# Patient Record
Sex: Female | Born: 1980 | Race: Black or African American | Hispanic: No | Marital: Single | Smoking: Current some day smoker
Health system: Southern US, Community
[De-identification: ages and names within clinical notes are randomized; demographics above are authoritative.]

## PROBLEM LIST (undated history)

## (undated) DIAGNOSIS — M549 Dorsalgia, unspecified: Secondary | ICD-10-CM

---

## 1998-08-04 ENCOUNTER — Emergency Department (HOSPITAL_COMMUNITY): Admission: EM | Admit: 1998-08-04 | Discharge: 1998-08-04 | Payer: Self-pay | Admitting: Emergency Medicine

## 1998-12-10 ENCOUNTER — Encounter: Admission: RE | Admit: 1998-12-10 | Discharge: 1998-12-10 | Payer: Self-pay | Admitting: Family Medicine

## 1999-01-01 ENCOUNTER — Encounter: Admission: RE | Admit: 1999-01-01 | Discharge: 1999-01-01 | Payer: Self-pay | Admitting: Family Medicine

## 2005-01-13 ENCOUNTER — Emergency Department (HOSPITAL_COMMUNITY): Admission: EM | Admit: 2005-01-13 | Discharge: 2005-01-13 | Payer: Self-pay | Admitting: Emergency Medicine

## 2005-05-25 ENCOUNTER — Emergency Department (HOSPITAL_COMMUNITY): Admission: EM | Admit: 2005-05-25 | Discharge: 2005-05-26 | Payer: Self-pay | Admitting: *Deleted

## 2007-02-21 ENCOUNTER — Emergency Department (HOSPITAL_COMMUNITY): Admission: EM | Admit: 2007-02-21 | Discharge: 2007-02-21 | Payer: Self-pay | Admitting: Emergency Medicine

## 2009-04-04 ENCOUNTER — Emergency Department (HOSPITAL_COMMUNITY): Admission: EM | Admit: 2009-04-04 | Discharge: 2009-04-04 | Payer: Self-pay | Admitting: Family Medicine

## 2009-11-13 ENCOUNTER — Emergency Department (HOSPITAL_COMMUNITY): Admission: EM | Admit: 2009-11-13 | Discharge: 2009-11-13 | Payer: Self-pay | Admitting: Emergency Medicine

## 2010-01-15 ENCOUNTER — Emergency Department (HOSPITAL_COMMUNITY): Admission: EM | Admit: 2010-01-15 | Discharge: 2010-01-15 | Payer: Self-pay | Admitting: Family Medicine

## 2010-03-29 ENCOUNTER — Emergency Department (HOSPITAL_COMMUNITY): Admission: EM | Admit: 2010-03-29 | Discharge: 2010-03-29 | Payer: Self-pay | Admitting: Emergency Medicine

## 2010-08-14 LAB — CULTURE, ROUTINE-ABSCESS

## 2012-01-01 ENCOUNTER — Emergency Department (INDEPENDENT_AMBULATORY_CARE_PROVIDER_SITE_OTHER)
Admission: EM | Admit: 2012-01-01 | Discharge: 2012-01-01 | Disposition: A | Discharge status: Home / Self Care | Payer: Self-pay | Source: Home / Self Care | Attending: Family Medicine | Admitting: Family Medicine

## 2012-01-01 ENCOUNTER — Encounter (HOSPITAL_COMMUNITY): Payer: Self-pay | Admitting: Emergency Medicine

## 2012-01-01 DIAGNOSIS — S43499A Other sprain of unspecified shoulder joint, initial encounter: Secondary | ICD-10-CM

## 2012-01-01 DIAGNOSIS — S46819A Strain of other muscles, fascia and tendons at shoulder and upper arm level, unspecified arm, initial encounter: Secondary | ICD-10-CM

## 2012-01-01 HISTORY — DX: Dorsalgia, unspecified: M54.9

## 2012-01-01 MED ORDER — METHOCARBAMOL 500 MG PO TABS: 500.0000 mg | ORAL_TABLET | Freq: Three times a day (TID) | ORAL | Status: AC

## 2012-01-01 MED ORDER — ACETAMINOPHEN-CODEINE #3 300-30 MG PO TABS: 1.0000 | ORAL_TABLET | Freq: Four times a day (QID) | ORAL | Status: AC | PRN

## 2012-01-01 MED ORDER — IBUPROFEN 600 MG PO TABS: 600.0000 mg | ORAL_TABLET | Freq: Three times a day (TID) | ORAL | Status: AC | PRN

## 2012-01-01 NOTE — ED Provider Notes (Signed)
History     CSN: [id number]  Arrival date & time 01/01/12  1105   First MD Initiated Contact with Patient 01/01/12 1105      Chief Complaint  Patient presents with  . Back Pain    (Consider location/radiation/quality/duration/timing/severity/associated sxs/prior treatment) HPI Comments: 31 year old female with history of recurrent back pain. Here complaining of back pain in the last 4 days. Reports her pain started in the lower back and is now in upper back and both sides of her neck. Denies pain radiation. No extremity weakness or paresthesia. 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 Patient reports she has received training in proper lifting technique. States she took a leftover muscle relaxant t tablet once a day in the first 2 days of her pain and one 800 ibuprofen tablet yesterday with minimal relief of her symptoms. Denies chest pain or shortness of breath. No cough or recent illness. No burning on urination or hematuria.   Past Medical History  Diagnosis Date  . Back pain     History reviewed. No pertinent past surgical history.  No family history on file.  History  Substance Use Topics  . Smoking status: Never Smoker   . Smokeless tobacco: Not on file  . Alcohol Use: No    OB History    Grav Para Term Preterm Abortions TAB SAB Ect Mult Living                  Review of Systems  Constitutional: Negative for fever, chills and fatigue.  HENT: Negative for congestion and sore throat.   Respiratory: Negative for cough and shortness of breath.   Gastrointestinal: Negative for nausea, vomiting, abdominal pain and diarrhea.  Genitourinary: Negative for dysuria, frequency, hematuria and flank pain.  Musculoskeletal: Positive for back pain.  Skin: Negative for rash.  Neurological: Negative for dizziness and headaches.    Allergies  Review of patient's allergies indicates no  known allergies.  Home Medications   Current Outpatient Rx  Name Route Sig Dispense Refill  . CYCLOBENZAPRINE HCL 10 MG PO TABS Oral Take 10 mg by mouth 3 (three) times daily as needed.    Marland Kitchen OVER THE COUNTER MEDICATION  Reports having old prescription medicine she thinks was a muscle relaxer-has "m" on one side and on the other "751".  Rubbing alcohol, epsom salts    . ACETAMINOPHEN-CODEINE #3 300-30 MG PO TABS Oral Take 1 tablet by mouth every 6 (six) hours as needed for pain. 20 tablet 0  . IBUPROFEN 600 MG PO TABS Oral Take 1 tablet (600 mg total) by mouth every 8 (eight) hours as needed for pain. 20 tablet 0  . METHOCARBAMOL 500 MG PO TABS Oral Take 1 tablet (500 mg total) by mouth 3 (three) times daily. 20 tablet 0    BP 99/63  Pulse 76  Temp 98.3 F (36.8 C) (Oral)  Resp 16  SpO2 100%  LMP 12/13/2011  Physical Exam  Nursing note and vitals reviewed. Constitutional: She is oriented to person, place, and time. She appears well-developed and well-nourished. No distress.  HENT:  Head: Normocephalic and atraumatic.  Right Ear: External ear normal.  Left Ear: External ear normal.  Mouth/Throat: Oropharynx is clear and moist. No oropharyngeal exudate.  Eyes: Conjunctivae are normal.  Neck: Normal range of motion. Neck supple. No JVD present. No thyromegaly present.       No pain with palpation  over spine bone prominences. Tenderness to palpation in bilateral paravertebral and trapezius muscles.  Otherwise FROM of the neck with negative Spurling's test.   Cardiovascular: Normal rate, regular rhythm and normal heart sounds.   Pulmonary/Chest: Effort normal and breath sounds normal. No respiratory distress. She has no wheezes. She has no rales. She exhibits no tenderness.  Abdominal: Soft. She exhibits no distension and no mass. There is no tenderness.       No CVT  Musculoskeletal:       Upper extremities and shoulders with from.   Lymphadenopathy:    She has no cervical  adenopathy.  Neurological: She is alert and oriented to person, place, and time.       Upper extremities with normal strength and appear neurovascularly intact.    Skin: No rash noted.    ED Course  Procedures (including critical care time)  Labs Reviewed - No data to display No results found.   1. Trapezius muscle strain       MDM  Treated with Robaxin, and ibuprofen and Tylenol No. 3. Recommended stretching and and back exercises as soon as pain improves. Return as needed.        Sharin Grave, MD 01/03/12 1103

## 2012-01-01 NOTE — ED Notes (Signed)
Reports back pain that started Monday -4 days ago.  History of back pain.  Mid to upper back pain-tingling/burning.  Took muscle relaxer and reports no improvement.  No numbness or tingling in arms.  Reports pain across shoulders.  Reports a lot of turning, twisting, bending routinely at work.  Denies any one incident causing pain

## 2012-01-01 NOTE — ED Notes (Signed)
Requested patient place on gown for physician evaluation

## 2013-03-13 ENCOUNTER — Encounter (HOSPITAL_COMMUNITY): Payer: Self-pay | Admitting: Emergency Medicine

## 2013-03-13 ENCOUNTER — Emergency Department (HOSPITAL_COMMUNITY)
Admission: EM | Admit: 2013-03-13 | Discharge: 2013-03-13 | Disposition: A | Discharge status: Home / Self Care | Payer: Self-pay | Attending: Emergency Medicine | Admitting: Emergency Medicine

## 2013-03-13 DIAGNOSIS — B9789 Other viral agents as the cause of diseases classified elsewhere: Secondary | ICD-10-CM | POA: Insufficient documentation

## 2013-03-13 DIAGNOSIS — B349 Viral infection, unspecified: Secondary | ICD-10-CM

## 2013-03-13 DIAGNOSIS — R05 Cough: Secondary | ICD-10-CM

## 2013-03-13 DIAGNOSIS — R5381 Other malaise: Secondary | ICD-10-CM | POA: Insufficient documentation

## 2013-03-13 DIAGNOSIS — R059 Cough, unspecified: Secondary | ICD-10-CM | POA: Insufficient documentation

## 2013-03-13 DIAGNOSIS — R52 Pain, unspecified: Secondary | ICD-10-CM | POA: Insufficient documentation

## 2013-03-13 MED ORDER — HYDROCODONE-HOMATROPINE 5-1.5 MG/5ML PO SYRP: 5.0000 mL | ORAL_SOLUTION | Freq: Four times a day (QID) | ORAL | Status: DC | PRN

## 2013-03-13 NOTE — ED Provider Notes (Signed)
CSN: [id number]     Arrival date & time 03/13/13  1532 History  This chart was scribed for non-physician practitioner, Teressa Lower, NP working with Purvis Sheffield, MD by Greggory Stallion, ED scribe. This patient was seen in room WTR7/WTR7 and the patient's care was started at 4:12 PM.   Chief Complaint  Patient presents with  . Cough  . Generalized Body Aches   The history is provided by the patient. No language interpreter was used.   HPI Comments: Stacy Woodard is a 32 y.o. female who presents to the Emergency Department complaining of unproductive cough, itchy throat and laryngitis that started 3 days ago. Pt is also having generalized body aches. She has taken robitussin with no relief. Pt denies nausea, emesis, diarrhea, fever, SOB and CP. Pt smokes cigarettes.   Past Medical History  Diagnosis Date  . Back pain    No past surgical history on file. No family history on file. History  Substance Use Topics  . Smoking status: Never Smoker   . Smokeless tobacco: Not on file  . Alcohol Use: No   OB History   Grav Para Term Preterm Abortions TAB SAB Ect Mult Living                 Review of Systems  Constitutional: Positive for fatigue. Negative for fever.  Respiratory: Positive for cough. Negative for shortness of breath.   Cardiovascular: Negative for chest pain.  Gastrointestinal: Negative for nausea, vomiting and diarrhea.  All other systems reviewed and are negative.    Allergies  Review of patient's allergies indicates no known allergies.  Home Medications   Current Outpatient Rx  Name  Route  Sig  Dispense  Refill  . cyclobenzaprine (FLEXERIL) 10 MG tablet   Oral   Take 10 mg by mouth 3 (three) times daily as needed.         Marland Kitchen OVER THE COUNTER MEDICATION      Reports having old prescription medicine she thinks was a muscle relaxer-has "m" on one side and on the other "751".  Rubbing alcohol, epsom salts          BP 127/86  Pulse 84  Temp(Src)  97.6 F (36.4 C) (Oral)  Resp 18  SpO2 98%  LMP 03/02/2013  Physical Exam  Nursing note and vitals reviewed. Constitutional: She is oriented to person, place, and time. She appears well-developed and well-nourished. No distress.  HENT:  Head: Normocephalic and atraumatic.  Throat mildly erythematous.   Eyes: EOM are normal.  Neck: Neck supple. No tracheal deviation present.  Cardiovascular: Normal rate, regular rhythm and normal heart sounds.   Pulmonary/Chest: Effort normal and breath sounds normal. No respiratory distress. She has no wheezes. She has no rales.  Musculoskeletal: Normal range of motion.  Neurological: She is alert and oriented to person, place, and time.  Skin: Skin is warm and dry.  Psychiatric: She has a normal mood and affect. Her behavior is normal.    ED Course  Procedures (including critical care time)  DIAGNOSTIC STUDIES: Oxygen Saturation is 98% on RA, normal by my interpretation.    COORDINATION OF CARE: 4:15 PM-Discussed treatment plan which includes symptomatic treatment with pt at bedside and pt agreed to plan.   Labs Review Labs Reviewed - No data to display Imaging Review No results found.  EKG Interpretation   None       MDM   1. Cough   2. Viral illness    Will treat  symptomatically    I personally performed the services described in this documentation, which was scribed in my presence. The recorded information has been reviewed and is accurate.   Teressa Lower, NP 03/13/13 [phone]

## 2013-03-13 NOTE — ED Notes (Signed)
Pt from home reports that she has had dry cough, itchy throat with laryngitis since x3 days. Pt denies N/V/D/fever, SOB, CP. Pt is A&O and in NAD

## 2013-03-14 NOTE — ED Provider Notes (Signed)
Medical screening examination/treatment/procedure(s) were performed by non-physician practitioner and as supervising physician I was immediately available for consultation/collaboration.  EKG Interpretation   None         Katilin Raynes S Quinesha Selinger, MD 03/14/13 1527 

## 2013-05-26 ENCOUNTER — Encounter (HOSPITAL_COMMUNITY): Payer: Self-pay | Admitting: Emergency Medicine

## 2013-05-26 ENCOUNTER — Emergency Department (HOSPITAL_COMMUNITY)
Admission: EM | Admit: 2013-05-26 | Discharge: 2013-05-26 | Disposition: A | Discharge status: Home / Self Care | Payer: Self-pay | Attending: Emergency Medicine | Admitting: Emergency Medicine

## 2013-05-26 DIAGNOSIS — J029 Acute pharyngitis, unspecified: Secondary | ICD-10-CM | POA: Insufficient documentation

## 2013-05-26 DIAGNOSIS — R112 Nausea with vomiting, unspecified: Secondary | ICD-10-CM | POA: Insufficient documentation

## 2013-05-26 LAB — RAPID STREP SCREEN (MED CTR MEBANE ONLY): STREPTOCOCCUS, GROUP A SCREEN (DIRECT): NEGATIVE

## 2013-05-26 MED ORDER — TRAMADOL HCL 50 MG PO TABS: 50.0000 mg | ORAL_TABLET | Freq: Four times a day (QID) | ORAL | Status: DC | PRN

## 2013-05-26 MED ORDER — KETOROLAC TROMETHAMINE 60 MG/2ML IM SOLN
60.0000 mg | Freq: Once | INTRAMUSCULAR | Status: AC
  Administered 2013-05-26: 60 mg via INTRAMUSCULAR
  Filled 2013-05-26: qty 2

## 2013-05-26 MED ORDER — NAPROXEN 500 MG PO TABS: 500.0000 mg | ORAL_TABLET | Freq: Two times a day (BID) | ORAL | Status: DC

## 2013-05-26 MED ORDER — DEXAMETHASONE SODIUM PHOSPHATE 10 MG/ML IJ SOLN
10.0000 mg | Freq: Once | INTRAMUSCULAR | Status: AC
  Administered 2013-05-26: 10 mg via INTRAMUSCULAR
  Filled 2013-05-26: qty 1

## 2013-05-26 NOTE — ED Provider Notes (Signed)
CSN: [id number]     Arrival date & time 05/26/13  1608 History   First MD Initiated Contact with Patient 05/26/13 1648     Chief Complaint  Patient presents with  . Sore Throat   (Consider location/radiation/quality/duration/timing/severity/associated sxs/prior Treatment) HPI Comments: Patient presents with sore throat x 3 days with associated fever, mild non-productive cough, vomiting. She has taken OTC meds without relief. She is losing voice. She is able to move neck without difficulty. The onset of this condition was acute. The course is constant. Aggravating factors: swallowing. Alleviating factors: none.    Patient is a 33 y.o. female presenting with pharyngitis. The history is provided by the patient.  Sore Throat Associated symptoms include chills, a fever, nausea, a sore throat and vomiting. Pertinent negatives include no abdominal pain, congestion, coughing, fatigue, headaches, myalgias or rash.    Past Medical History  Diagnosis Date  . Back pain    History reviewed. No pertinent past surgical history. No family history on file. History  Substance Use Topics  . Smoking status: Never Smoker   . Smokeless tobacco: Not on file  . Alcohol Use: No   OB History   Grav Para Term Preterm Abortions TAB SAB Ect Mult Living                 Review of Systems  Constitutional: Positive for fever, chills and appetite change. Negative for fatigue.  HENT: Positive for sore throat and trouble swallowing (able to swallow but painful). Negative for congestion, ear pain, rhinorrhea and sinus pressure.   Eyes: Negative for redness.  Respiratory: Negative for cough and wheezing.   Gastrointestinal: Positive for nausea and vomiting. Negative for abdominal pain and diarrhea.  Genitourinary: Negative for dysuria.  Musculoskeletal: Negative for myalgias and neck stiffness.  Skin: Negative for rash.  Neurological: Negative for headaches.  Hematological: Negative for adenopathy.     Allergies  Review of patient's allergies indicates no known allergies.  Home Medications   Current Outpatient Rx  Name  Route  Sig  Dispense  Refill  . naproxen (NAPROSYN) 500 MG tablet   Oral   Take 1 tablet (500 mg total) by mouth 2 (two) times daily.   20 tablet   0   . traMADol (ULTRAM) 50 MG tablet   Oral   Take 1 tablet (50 mg total) by mouth every 6 (six) hours as needed.   15 tablet   0    BP 136/83  Pulse 96  Temp(Src) 101.1 F (38.4 C) (Oral)  Resp 20  SpO2 99% Physical Exam  Nursing note and vitals reviewed. Constitutional: She appears well-developed and well-nourished.  HENT:  Head: Normocephalic and atraumatic.  Right Ear: External ear normal.  Left Ear: External ear normal.  Nose: Nose normal. No mucosal edema or rhinorrhea.  Mouth/Throat: Uvula is midline and mucous membranes are normal. Mucous membranes are not dry. No oral lesions. No trismus in the jaw. No uvula swelling. Posterior oropharyngeal erythema present. No oropharyngeal exudate, posterior oropharyngeal edema or tonsillar abscesses.  Eyes: Conjunctivae are normal. Right eye exhibits no discharge. Left eye exhibits no discharge.  Neck: Normal range of motion. Neck supple.  Cardiovascular: Normal rate, regular rhythm and normal heart sounds.   Pulmonary/Chest: Effort normal and breath sounds normal. No respiratory distress. She has no wheezes. She has no rales.  Abdominal: Soft. There is no tenderness.  Lymphadenopathy:    She has no cervical adenopathy.  Neurological: She is alert.  Skin: Skin is warm  and dry.  Psychiatric: She has a normal mood and affect.    ED Course  Procedures (including critical care time) Labs Review Labs Reviewed  RAPID STREP SCREEN  CULTURE, GROUP A STREP   Imaging Review No results found.  EKG Interpretation   None      5:05 PM Patient seen and examined. Work-up initiated. Medications ordered.   Vital signs reviewed and are as follows: Filed  Vitals:   05/26/13 1624  BP: 136/83  Pulse: 96  Temp: 101.1 F (38.4 C)  Resp: 20   Strep performed.   6:04 PM Strep neg. Pt feeling slightly better. Will d/c to home with tramadol, naproxen.   Patient counseled on supportive care for viral infection and s/s to return including worsening symptoms, persistent fever, persistent vomiting, or if they have any other concerns.  Urged to see PCP if symptoms persist for more than 3 days. Patient verbalizes understanding and agrees with plan.   Patient counseled on use of narcotic pain medications. Counseled not to combine these medications with others containing tylenol. Urged not to drink alcohol, drive, or perform any other activities that requires focus while taking these medications. The patient verbalizes understanding and agrees with the plan.    MDM   1. Pharyngitis    Pharyngitis/fever: no abscess seen on exam, no deep space infection suspected. Patient without trismus and full ROM neck without pain. She appears non-toxic. Given steroids/toradol in ED for symptomatic relief. Pain medicine and NSAID for home.     Renne CriglerJoshua Adianna Darwin, PA-C 05/26/13 1806

## 2013-05-26 NOTE — Discharge Instructions (Signed)
Please read and follow all provided instructions.  Your diagnoses today include:  1. Pharyngitis     Tests performed today include:  Strep test: was negative for strep throat  Strep culture: you will be notified if this comes back positive  Vital signs. See below for your results today.   Medications prescribed:   Tramadol - narcotic-like pain medication  DO NOT drive or perform any activities that require you to be awake and alert because this medicine can make you drowsy.    Naproxen - anti-inflammatory pain medication  Do not exceed 500mg  naproxen every 12 hours, take with food  You have been prescribed an anti-inflammatory medication or NSAID. Take with food. Take smallest effective dose for the shortest duration needed for your pain. Stop taking if you experience stomach pain or vomiting.   Home care instructions:  Please read the educational materials provided and follow any instructions contained in this packet.  Follow-up instructions: Please follow-up with your primary care provider as needed for further evaluation of your symptoms.  If you do not have a primary care doctor -- see below for referral information.   Return instructions:   Please return to the Emergency Department if you experience worsening symptoms.   Return if you have worsening problems swallowing, your neck becomes swollen, you cannot swallow your saliva or your voice becomes muffled.   Return with high persistent fever, persistent vomiting, or if you have trouble breathing.   Please return if you have any other emergent concerns.  Additional Information:  Your vital signs today were: BP 136/83   Pulse 96   Temp(Src) 101.1 F (38.4 C) (Oral)   Resp 20   SpO2 99% If your blood pressure (BP) was elevated above 135/85 this visit, please have this repeated by your doctor within one month. --------------

## 2013-05-26 NOTE — ED Notes (Addendum)
Pt reports having a itchy throat and difficulty swallowing. Pt reports the sore throat started three days ago. Pt has an oxygen saturation of 99% on room air and airway is patent. Pt reports having nausea and emesis. Pt is A/O x4.  Pt unable to tolerate strep test in triage.

## 2013-05-26 NOTE — ED Provider Notes (Signed)
Medical screening examination/treatment/procedure(s) were performed by non-physician practitioner and as supervising physician I was immediately available for consultation/collaboration.   Celene KrasJon R Takenya Travaglini, MD 05/26/13 [id number]

## 2013-05-28 LAB — CULTURE, GROUP A STREP

## 2014-10-27 ENCOUNTER — Encounter (HOSPITAL_COMMUNITY): Payer: Self-pay

## 2014-10-27 ENCOUNTER — Emergency Department (HOSPITAL_COMMUNITY): Payer: 59

## 2014-10-27 ENCOUNTER — Emergency Department (HOSPITAL_COMMUNITY)
Admission: EM | Admit: 2014-10-27 | Discharge: 2014-10-27 | Disposition: A | Discharge status: Home / Self Care | Payer: 59 | Attending: Emergency Medicine | Admitting: Emergency Medicine

## 2014-10-27 DIAGNOSIS — R200 Anesthesia of skin: Secondary | ICD-10-CM | POA: Diagnosis not present

## 2014-10-27 DIAGNOSIS — M79645 Pain in left finger(s): Secondary | ICD-10-CM

## 2014-10-27 DIAGNOSIS — Z720 Tobacco use: Secondary | ICD-10-CM | POA: Insufficient documentation

## 2014-10-27 DIAGNOSIS — Z791 Long term (current) use of non-steroidal anti-inflammatories (NSAID): Secondary | ICD-10-CM | POA: Diagnosis not present

## 2014-10-27 MED ORDER — MELOXICAM 7.5 MG PO TABS: 15.0000 mg | ORAL_TABLET | Freq: Every day | ORAL | Status: DC

## 2014-10-27 NOTE — ED Provider Notes (Signed)
CSN: [id number]     Arrival date & time 10/27/14  1808 History  This chart was scribed for non-physician provider Antony Madura, PA-C, working with Purvis Sheffield, MD by Phillis Haggis, ED Scribe. This patient was seen in room WTR8/WTR8 and patient care was started at 8:01 PM.   Chief Complaint  Patient presents with  . Thumb pain    The history is provided by the patient. No language interpreter was used.  HPI Comments: Stacy Woodard is a 34 y.o. female who presents to the Emergency Department complaining of left thumb pain onset two weeks ago. She reports that swelling and numbness started today. She states that her hand being cold and putting pressure on the sides of the thumb worsens the aching pain. She states that she is unable to bend the thumb fully. 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 She denies that the pain, numbness or swelling radiates to the palm of her left hand. She denies injury to the area. She denies taking anything for the pain or icing the area.   Past Medical History  Diagnosis Date  . Back pain    History reviewed. No pertinent past surgical history. History reviewed. No pertinent family history. History  Substance Use Topics  . Smoking status: Current Some Day Smoker    Types: Cigarettes  . Smokeless tobacco: Not on file  . Alcohol Use: Yes     Comment: occ   OB History    No data available      Review of Systems  Musculoskeletal: Positive for joint swelling and arthralgias.  Neurological: Positive for numbness.  All other systems reviewed and are negative.   Allergies  Review of patient's allergies indicates no known allergies.  Home Medications   Prior to Admission medications   Medication Sig Start Date End Date Taking? Authorizing Provider  meloxicam (MOBIC) 7.5 MG tablet Take 2 tablets (15 mg total) by mouth daily. 10/27/14   Antony Madura, PA-C  naproxen  (NAPROSYN) 500 MG tablet Take 1 tablet (500 mg total) by mouth 2 (two) times daily. 05/26/13   Renne Crigler, PA-C  traMADol (ULTRAM) 50 MG tablet Take 1 tablet (50 mg total) by mouth every 6 (six) hours as needed. 05/26/13   Renne Crigler, PA-C   BP 130/84 mmHg  Pulse 94  Temp(Src) 98.1 F (36.7 C) (Oral)  Resp 20  SpO2 96%  LMP 10/11/2014  Physical Exam  Constitutional: She is oriented to person, place, and time. She appears well-developed and well-nourished. No distress.  HENT:  Head: Normocephalic and atraumatic.  Eyes: Conjunctivae and EOM are normal. No scleral icterus.  Neck: Normal range of motion.  Cardiovascular: Normal rate, regular rhythm and intact distal pulses.   Still radial pulse 2+ in the left upper extremity. Capillary refill brisk in all digits of left hand.  Pulmonary/Chest: Effort normal. No respiratory distress.  Musculoskeletal: Normal range of motion.       Left hand: She exhibits tenderness and bony tenderness (distal IP joint of L thumb). She exhibits normal range of motion, normal capillary refill, no deformity, no laceration and no swelling. Normal sensation noted. Normal strength noted.       Hands: 5/5 strength against resistance of flexors and extensors of left thumb  Neurological: She is alert and oriented to person, place, and time. She exhibits normal muscle tone. Coordination normal.  Sensation to light touch intact. Finger to thumb opposition  intact in the L hand.  Skin: Skin is warm and dry. No rash noted. She is not diaphoretic. No erythema. No pallor.  Psychiatric: She has a normal mood and affect. Her behavior is normal.  Nursing note and vitals reviewed.   ED Course  Procedures (including critical care time) DIAGNOSTIC STUDIES: Oxygen Saturation is 96% on RA, normal by my interpretation.    COORDINATION OF CARE: 8:07 PM-Discussed treatment plan which includes finger splint, anti-inflammatories, referral to hand specialist, and icing the  area with pt at bedside and pt agreed to plan.   Labs Review Labs Reviewed - No data to display  Imaging Review Dg Finger Thumb Left  10/27/2014   CLINICAL DATA:  Left thumb pain for 1 week and swelling  EXAM: LEFT THUMB 2+V  COMPARISON:  None.  FINDINGS: There is no evidence of fracture or dislocation. There is no evidence of arthropathy or other focal bone abnormality. Soft tissues are unremarkable  IMPRESSION: Negative.   Electronically Signed   By: Christiana Pellant M.D.   On: 10/27/2014 19:31    EKG Interpretation None      MDM   Final diagnoses:  Thumb pain, left    34 year old female presents to the emergency department for further evaluation of left thumb pain 2 weeks. No hx of trauma/injury. Patient neurovascularly intact. 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 No concern for septic joint, cellulitis, felon, or paronychia. Patient placed on anti-inflammatories and thumb splinted in ED. Hand specialist referral given should patient desire further evaluation of her pain. Return precautions discussed and provided. Patient agreeable to plan with no unaddressed concerns. Patient discharged in good condition  I personally performed the services described in this documentation, which was scribed in my presence. The recorded information has been reviewed and is accurate.   Filed Vitals:   10/27/14 1858  BP: 130/84  Pulse: 94  Temp: 98.1 F (36.7 C)  TempSrc: Oral  Resp: 20  SpO2: 96%      Antony Madura, PA-C 10/27/14 2043  Purvis Sheffield, MD 10/28/14 1028

## 2014-10-27 NOTE — ED Notes (Signed)
Pt c/o L thumb pain x 2 weeks and swelling starting today.  Denies pain and injury.  Pt has not taken anything for pain.

## 2014-10-27 NOTE — Discharge Instructions (Signed)

## 2017-03-31 ENCOUNTER — Emergency Department (HOSPITAL_COMMUNITY)
Admission: EM | Admit: 2017-03-31 | Discharge: 2017-04-01 | Disposition: A | Discharge status: Home / Self Care | Payer: 59 | Attending: Emergency Medicine | Admitting: Emergency Medicine

## 2017-03-31 ENCOUNTER — Encounter (HOSPITAL_COMMUNITY): Payer: Self-pay

## 2017-03-31 DIAGNOSIS — R05 Cough: Secondary | ICD-10-CM | POA: Insufficient documentation

## 2017-03-31 DIAGNOSIS — R1110 Vomiting, unspecified: Secondary | ICD-10-CM | POA: Insufficient documentation

## 2017-03-31 DIAGNOSIS — J3489 Other specified disorders of nose and nasal sinuses: Secondary | ICD-10-CM | POA: Insufficient documentation

## 2017-03-31 DIAGNOSIS — R5381 Other malaise: Secondary | ICD-10-CM | POA: Insufficient documentation

## 2017-03-31 DIAGNOSIS — J069 Acute upper respiratory infection, unspecified: Secondary | ICD-10-CM | POA: Insufficient documentation

## 2017-03-31 DIAGNOSIS — R491 Aphonia: Secondary | ICD-10-CM | POA: Insufficient documentation

## 2017-03-31 DIAGNOSIS — B9789 Other viral agents as the cause of diseases classified elsewhere: Secondary | ICD-10-CM | POA: Insufficient documentation

## 2017-03-31 DIAGNOSIS — Z79899 Other long term (current) drug therapy: Secondary | ICD-10-CM | POA: Insufficient documentation

## 2017-03-31 DIAGNOSIS — R0981 Nasal congestion: Secondary | ICD-10-CM | POA: Insufficient documentation

## 2017-03-31 DIAGNOSIS — F17210 Nicotine dependence, cigarettes, uncomplicated: Secondary | ICD-10-CM | POA: Insufficient documentation

## 2017-03-31 LAB — RAPID STREP SCREEN (MED CTR MEBANE ONLY): STREPTOCOCCUS, GROUP A SCREEN (DIRECT): NEGATIVE

## 2017-03-31 NOTE — ED Triage Notes (Signed)
Pt states that on Sunday she has a sore throat with itching, yesterday felt sick on her stomach and was dry heaving, lost her voice. Pt also c/o of gas, denies abd pain. Congestion, and runny nose. Denies fevers.

## 2017-04-01 MED ORDER — CHLORHEXIDINE GLUCONATE 0.12 % MT SOLN: 15.0000 mL | Freq: Two times a day (BID) | OROMUCOSAL | 0 refills | Status: DC

## 2017-04-01 MED ORDER — IBUPROFEN 600 MG PO TABS: 600.0000 mg | ORAL_TABLET | Freq: Four times a day (QID) | ORAL | 0 refills | Status: DC | PRN

## 2017-04-01 NOTE — ED Provider Notes (Signed)
[facility]ENC[facility] Provider Note   CSN: [id number] Arrival date & time: 03/31/17  2236     History   Chief Complaint Chief Complaint  Patient presents with  . URI    HPI Stacy Woodard is a 36 y.o. female.  HPI    36 year old female presenting with cold sxs.  Patient report for the past 3 days she has had sore throat, loss of her voice, nonproductive cough, sinus congestion, rhinorrhea, head congestion, general malaise, and feeling well.  She reports occasional vomiting and dry heaving.  She also noticed increased flatulence, having bowel sounds.  States she is drinking plenty of fluid and has been taking Robitussin, cough drops, Mucinex without relief.  She had her flu shot.  She denies fever, as of breath, abdominal cramping, diarrhea, dysuria, or rash.  She works for [employer] and has to stay outside and did call out of work yesterday.      Past Medical History:  Diagnosis Date  . Back pain     There are no active problems to display for this patient.   History reviewed. No pertinent surgical history.  OB History    No data available       Home Medications    Prior to Admission medications   Medication Sig Start Date End Date Taking? Authorizing Provider  meloxicam (MOBIC) 7.5 MG tablet Take 2 tablets (15 mg total) by mouth daily. 10/27/14   Antony MaduraHumes, Kelly, PA-C  naproxen (NAPROSYN) 500 MG tablet Take 1 tablet (500 mg total) by mouth 2 (two) times daily. 05/26/13   Renne CriglerGeiple, Joshua, PA-C  traMADol (ULTRAM) 50 MG tablet Take 1 tablet (50 mg total) by mouth every 6 (six) hours as needed. 05/26/13   Renne CriglerGeiple, Joshua, PA-C    Family History No family history on file.  Social History Social History   Tobacco Use  . Smoking status: Current Some Day Smoker    Types: Cigarettes  . Smokeless tobacco: Never Used  Substance Use Topics  . Alcohol use: Yes    Comment: occ  . Drug use: No     Allergies   Patient has no known  allergies.   Review of Systems Review of Systems  All other systems reviewed and are negative.    Physical Exam Updated Vital Signs BP (!) 138/93 (BP Location: Right Arm)   Pulse 86   Temp 98.2 F (36.8 C) (Oral)   Resp 18   LMP 03/12/2017   SpO2 100%   Physical Exam  Constitutional: She appears well-developed and well-nourished. No distress.  HENT:  Head: Atraumatic.  Right Ear: External ear normal.  Left Ear: External ear normal.  Nose: Nose normal.  Mouth/Throat: Oropharynx is clear and moist.  Eyes: Conjunctivae are normal.  Neck: Normal range of motion. Neck supple.  Cardiovascular: Normal rate and regular rhythm.  Pulmonary/Chest: Effort normal and breath sounds normal.  Abdominal: Soft. Bowel sounds are normal. She exhibits no distension. There is no tenderness.  Lymphadenopathy:    She has no cervical adenopathy.  Neurological: She is alert.  Skin: No rash noted.  Psychiatric: She has a normal mood and affect.  Nursing note and vitals reviewed.    ED Treatments / Results  Labs (all labs ordered are listed, but only abnormal results are displayed) Labs Reviewed  RAPID STREP SCREEN (NOT AT [facility])  CULTURE, GROUP A STREP [facility]    EKG  EKG Interpretation None       Radiology No  results found.  Procedures Procedures (including critical care time)  Medications Ordered in ED Medications - No data to display   Initial Impression / Assessment and Plan / ED Course  I have reviewed the triage vital signs and the nursing notes.  Pertinent labs & imaging results that were available during my care of the patient were reviewed by me and considered in my medical decision making (see chart for details).     BP (!) 138/93 (BP Location: Right Arm)   Pulse 86   Temp 98.2 F (36.8 C) (Oral)   Resp 18   LMP 03/12/2017   SpO2 100%    Final Clinical Impressions(s) / ED Diagnoses   Final diagnoses:  Viral URI with cough    ED Discharge Orders     None     Pt symptoms consistent with URI. Pt will be discharged with symptomatic treatment.  Discussed return precautions.  Pt is hemodynamically stable & in NAD prior to discharge.    Fayrene Helperran, Shawnae Leiva, PA-C 04/01/17 0023    Nira Connardama, Pedro Eduardo, MD 04/04/17 [phone]

## 2017-04-03 LAB — CULTURE, GROUP A STREP (THRC)

## 2019-03-08 ENCOUNTER — Other Ambulatory Visit: Payer: Self-pay

## 2019-03-08 DIAGNOSIS — Z20822 Contact with and (suspected) exposure to covid-19: Secondary | ICD-10-CM

## 2019-03-10 LAB — NOVEL CORONAVIRUS, NAA: SARS-CoV-2, NAA: NOT DETECTED

## 2019-11-07 ENCOUNTER — Emergency Department (HOSPITAL_COMMUNITY)
Admission: EM | Admit: 2019-11-07 | Discharge: 2019-11-07 | Disposition: A | Discharge status: Home / Self Care | Payer: Self-pay | Attending: Emergency Medicine | Admitting: Emergency Medicine

## 2019-11-07 ENCOUNTER — Encounter (HOSPITAL_COMMUNITY): Payer: Self-pay

## 2019-11-07 ENCOUNTER — Emergency Department (HOSPITAL_COMMUNITY): Payer: Self-pay

## 2019-11-07 ENCOUNTER — Other Ambulatory Visit: Payer: Self-pay

## 2019-11-07 DIAGNOSIS — M79673 Pain in unspecified foot: Secondary | ICD-10-CM

## 2019-11-07 DIAGNOSIS — M79671 Pain in right foot: Secondary | ICD-10-CM | POA: Insufficient documentation

## 2019-11-07 DIAGNOSIS — F17210 Nicotine dependence, cigarettes, uncomplicated: Secondary | ICD-10-CM | POA: Insufficient documentation

## 2019-11-07 MED ORDER — NAPROXEN 375 MG PO TABS: 375.0000 mg | ORAL_TABLET | Freq: Two times a day (BID) | ORAL | 0 refills | Status: DC

## 2019-11-07 NOTE — ED Triage Notes (Signed)
patient states she works 4 10 hour shifts and is now having right foot pain.

## 2019-11-07 NOTE — Discharge Instructions (Addendum)
Contact a health care provider if you:  Have symptoms that do not go away after caring for yourself at home.  Have pain that gets worse.  Have pain that affects your ability to move or do your daily activities.

## 2019-11-07 NOTE — ED Provider Notes (Signed)
[facility]-EMERGENCY DEPT Provider Note   CSN: [id number] Arrival date & time: 11/07/19  1514     History Chief Complaint  Patient presents with  . Foot Pain    Stacy Woodard is a 39 y.o. female.    Foot Pain This is a new problem. The current episode started yesterday. The problem occurs rarely. The problem has not changed since onset.Pertinent negatives include no chest pain, no abdominal pain, no headaches and no shortness of breath. The symptoms are aggravated by walking. The symptoms are relieved by eating. She has tried nothing for the symptoms.       Past Medical History:  Diagnosis Date  . Back pain     There are no problems to display for this patient.   History reviewed. No pertinent surgical history.   OB History   No obstetric history on file.     Family History  Problem Relation Age of Onset  . Diabetes Mother     Social History   Tobacco Use  . Smoking status: Current Some Day Smoker    Types: Cigarettes  . Smokeless tobacco: Never Used  Vaping Use  . Vaping Use: Never used  Substance Use Topics  . Alcohol use: Yes    Comment: occ  . Drug use: No    Home Medications Prior to Admission medications   Medication Sig Start Date End Date Taking? Authorizing Provider  chlorhexidine (PERIDEX) 0.12 % solution Use as directed 15 mLs in the mouth or throat 2 (two) times daily. 04/01/17   Fayrene Helper, PA-C  ibuprofen (ADVIL,MOTRIN) 600 MG tablet Take 1 tablet (600 mg total) by mouth every 6 (six) hours as needed. 04/01/17   Fayrene Helper, PA-C  meloxicam (MOBIC) 7.5 MG tablet Take 2 tablets (15 mg total) by mouth daily. 10/27/14   Antony Madura, PA-C  naproxen (NAPROSYN) 500 MG tablet Take 1 tablet (500 mg total) by mouth 2 (two) times daily. 05/26/13   Renne Crigler, PA-C  traMADol (ULTRAM) 50 MG tablet Take 1 tablet (50 mg total) by mouth every 6 (six) hours as needed. 05/26/13   Renne Crigler, PA-C    Allergies    Patient  has no known allergies.  Review of Systems   Review of Systems  Respiratory: Negative for shortness of breath.   Cardiovascular: Negative for chest pain.  Gastrointestinal: Negative for abdominal pain.  Musculoskeletal: Positive for gait problem.  Skin: Negative for wound.  Neurological: Negative for headaches.    Physical Exam Updated Vital Signs BP 123/90 (BP Location: Right Arm)   Pulse 82   Temp 98.4 F (36.9 C) (Oral)   Resp 14   Ht 5\' 4"  (1.626 m)   Wt 61.7 kg   LMP 11/06/2019   SpO2 100%   BMI 23.34 kg/m   Physical Exam Vitals and nursing note reviewed.  Constitutional:      General: She is not in acute distress.    Appearance: She is well-developed. She is not diaphoretic.  HENT:     Head: Normocephalic and atraumatic.  Eyes:     General: No scleral icterus.    Conjunctiva/sclera: Conjunctivae normal.  Cardiovascular:     Rate and Rhythm: Normal rate and regular rhythm.     Heart sounds: Normal heart sounds. No murmur heard.  No friction rub. No gallop.   Pulmonary:     Effort: Pulmonary effort is normal. No respiratory distress.     Breath sounds: Normal breath sounds.  Abdominal:  General: Bowel sounds are normal. There is no distension.     Palpations: Abdomen is soft. There is no mass.     Tenderness: There is no abdominal tenderness. There is no guarding.  Musculoskeletal:     Cervical back: Normal range of motion.     Right foot: Normal.     Left foot: Normal.  Skin:    General: Skin is warm and dry.  Neurological:     Mental Status: She is alert and oriented to person, place, and time.  Psychiatric:        Behavior: Behavior normal.     ED Results / Procedures / Treatments   Labs (all labs ordered are listed, but only abnormal results are displayed) Labs Reviewed - No data to display  EKG None  Radiology DG Foot Complete Right  Result Date: 11/07/2019 CLINICAL DATA:  Diffuse right foot pain.  No known injury. EXAM: RIGHT FOOT  COMPLETE - 3+ VIEW COMPARISON:  None. FINDINGS: There is no evidence of fracture or dislocation. There is no evidence of arthropathy or other focal bone abnormality. Soft tissues are unremarkable. IMPRESSION: Normal exam. Electronically Signed   By: Inge Rise M.D.   On: 11/07/2019 16:06    Procedures Procedures (including critical care time)  Medications Ordered in ED Medications - No data to display  ED Course  I have reviewed the triage vital signs and the nursing notes.  Pertinent labs & imaging results that were available during my care of the patient were reviewed by me and considered in my medical decision making (see chart for details).    MDM Rules/Calculators/A&P                          Patient complains of foot pain started after 410-hour shifts on her feet.  See her right foot.  Worse with bearing weight and ambulation.  She has some pain in the heel and the medial arch.  She has no pain at rest.  No swelling, injuries noted.  I personally reviewed the images of the right foot which showed no acute abnormalities or heel spurs.  Advised supportive care, ice, anti-inflammatories, good arch support.  Follow-up with podiatry if symptoms are not improving.    Final Clinical Impression(s) / ED Diagnoses Final diagnoses:  Foot pain    Rx / DC Orders ED Discharge Orders    None       Shakerra, Red, PA-C 11/07/19 1730    Dorie Rank, MD 11/08/19 [phone]

## 2021-05-02 IMAGING — CR DG FOOT COMPLETE 3+V*R*
3 series · 3 of 3 positions shown · non-contrast
Comparison: None.

CLINICAL DATA: Diffuse right foot pain.  No known injury.

EXAM:
RIGHT FOOT COMPLETE - 3+ VIEW

[x foot ap right]
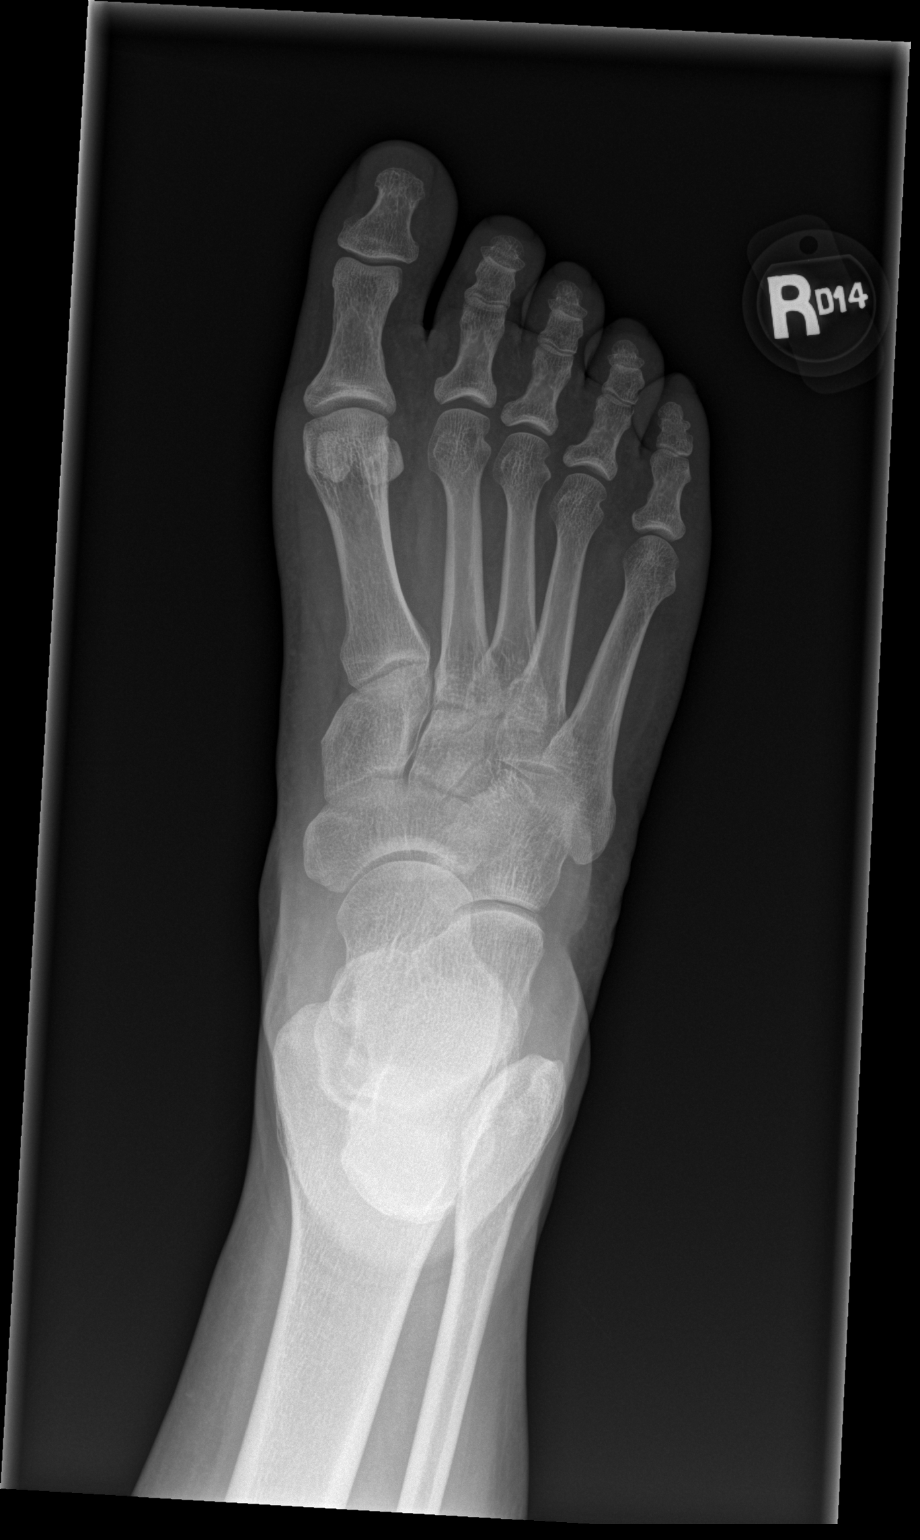

[x foot obl right]
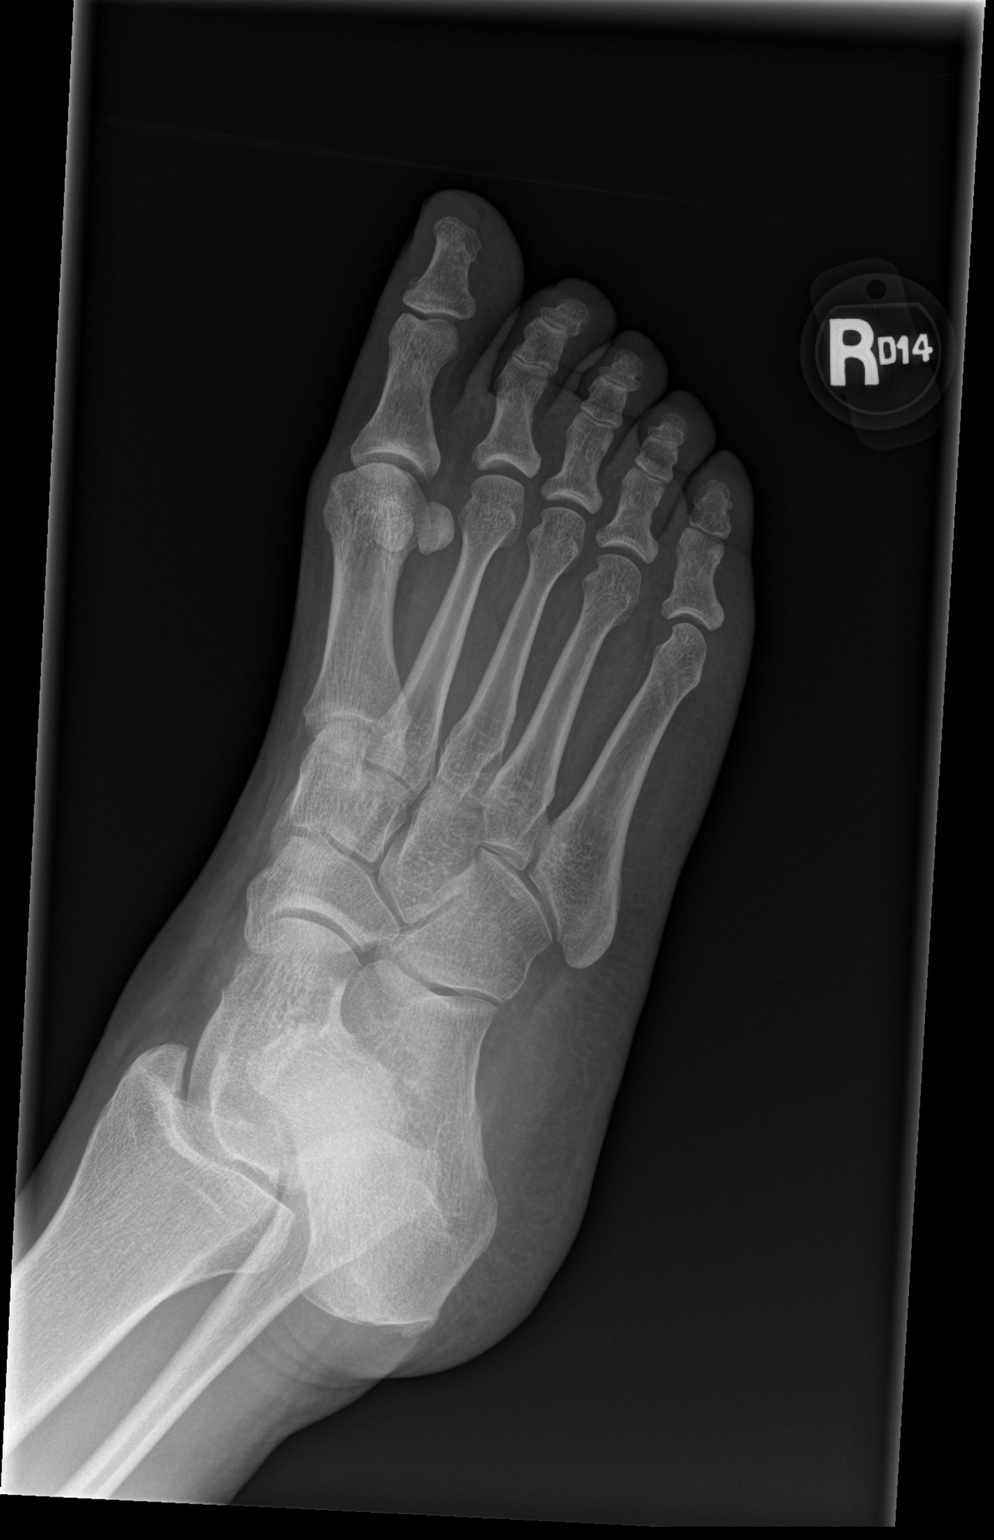

[x foot lat right]
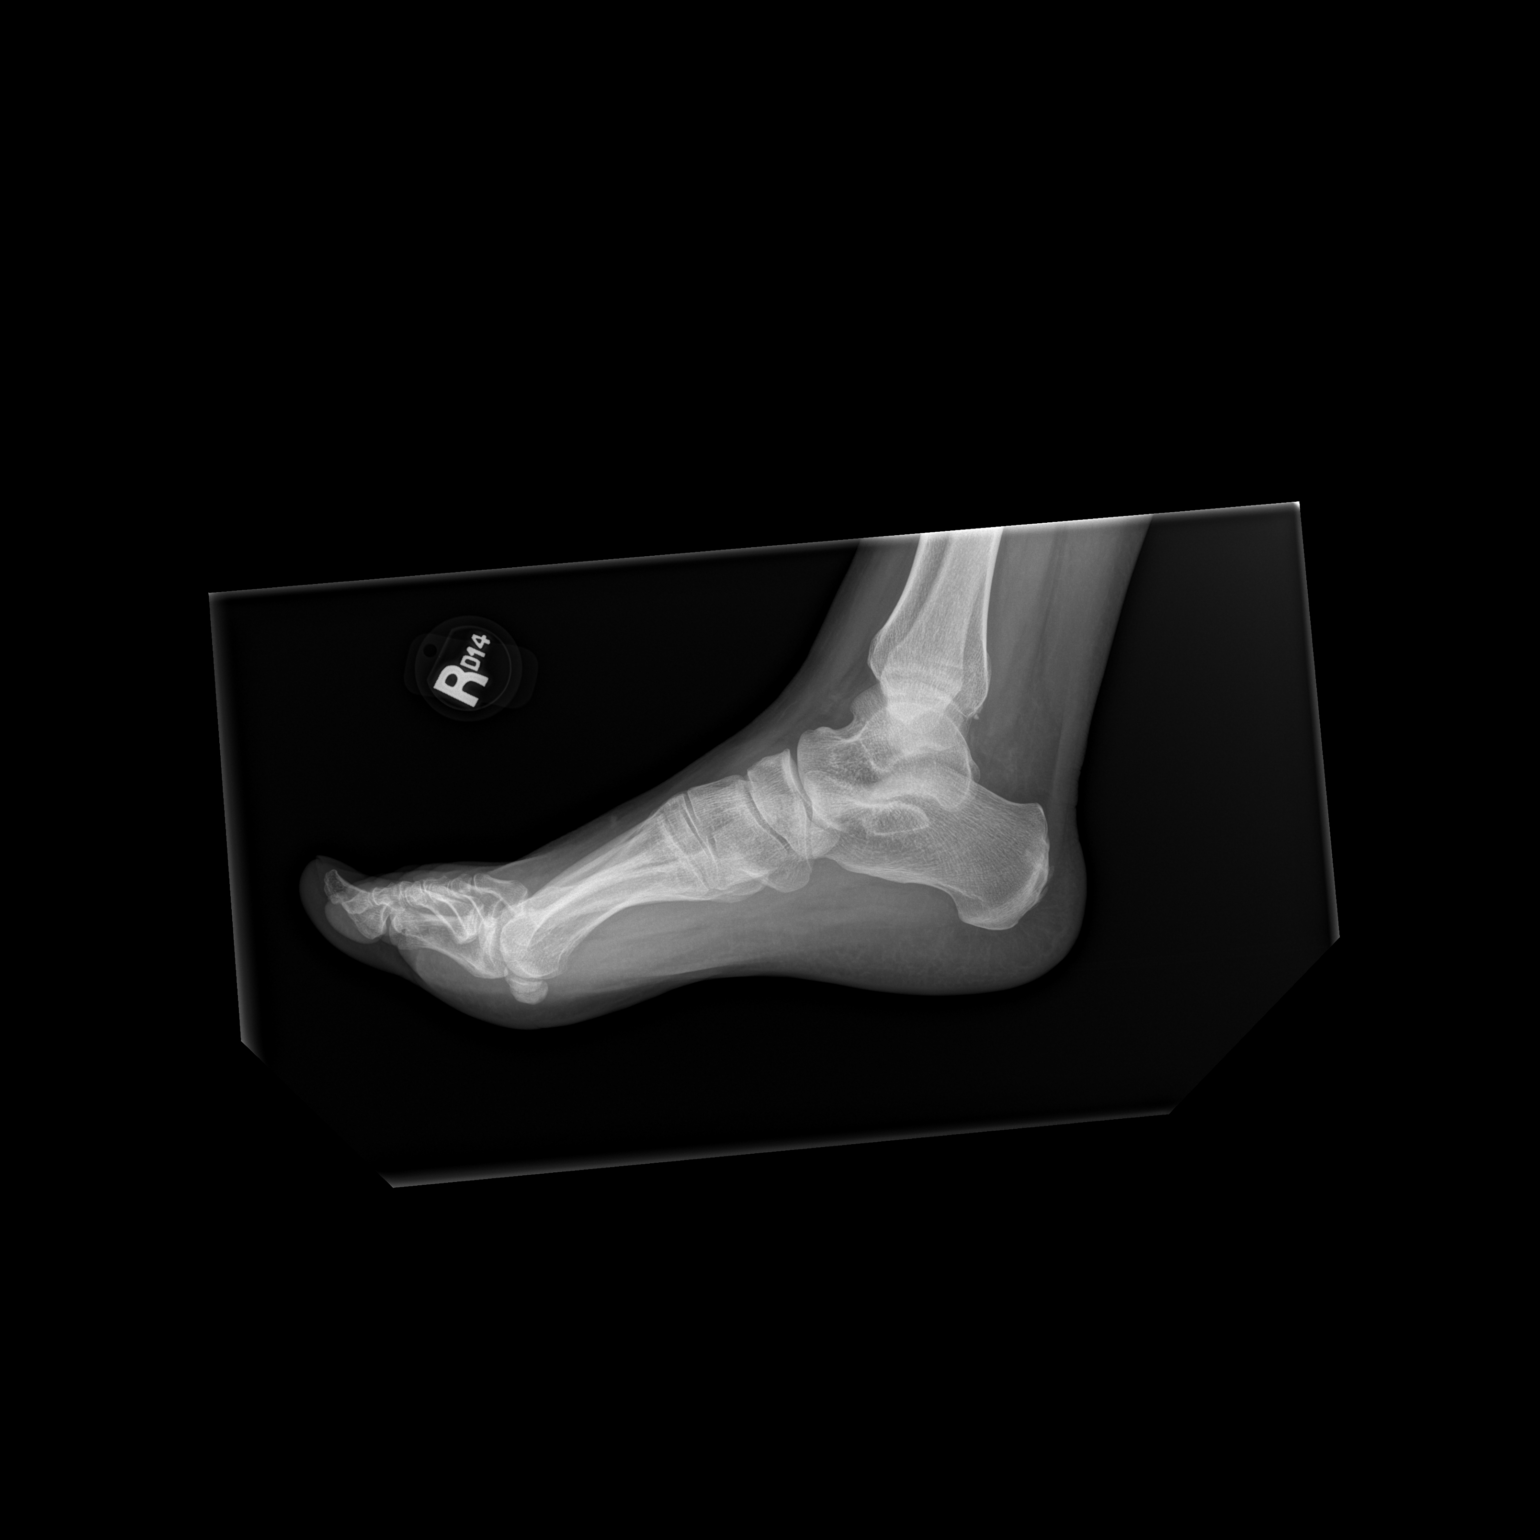

[3 of 3 positions shown; findings below may reference images not displayed]

FINDINGS: There is no evidence of fracture or dislocation. There is no
evidence of arthropathy or other focal bone abnormality. Soft
tissues are unremarkable.
IMPRESSION: Normal exam.

## 2022-03-18 ENCOUNTER — Emergency Department (HOSPITAL_COMMUNITY): Admission: EM | Admit: 2022-03-18 | Discharge: 2022-03-18 | Discharge status: Against Medical Advice | Payer: Self-pay

## 2023-06-05 ENCOUNTER — Emergency Department (HOSPITAL_BASED_OUTPATIENT_CLINIC_OR_DEPARTMENT_OTHER)
Admission: EM | Admit: 2023-06-05 | Discharge: 2023-06-05 | Disposition: A | Discharge status: Home / Self Care | Payer: Managed Care, Other (non HMO) | Attending: Emergency Medicine | Admitting: Emergency Medicine

## 2023-06-05 ENCOUNTER — Encounter (HOSPITAL_BASED_OUTPATIENT_CLINIC_OR_DEPARTMENT_OTHER): Payer: Self-pay | Admitting: Emergency Medicine

## 2023-06-05 ENCOUNTER — Emergency Department (HOSPITAL_BASED_OUTPATIENT_CLINIC_OR_DEPARTMENT_OTHER): Payer: Managed Care, Other (non HMO)

## 2023-06-05 ENCOUNTER — Other Ambulatory Visit: Payer: Self-pay

## 2023-06-05 DIAGNOSIS — Z79899 Other long term (current) drug therapy: Secondary | ICD-10-CM | POA: Insufficient documentation

## 2023-06-05 DIAGNOSIS — R1031 Right lower quadrant pain: Secondary | ICD-10-CM | POA: Insufficient documentation

## 2023-06-05 LAB — CBC
HCT: 40.2 % (ref 36.0–46.0)
Hemoglobin: 13.4 g/dL (ref 12.0–15.0)
MCH: 30.2 pg (ref 26.0–34.0)
MCHC: 33.3 g/dL (ref 30.0–36.0)
MCV: 90.5 fL (ref 80.0–100.0)
Platelets: 418 10*3/uL — ABNORMAL HIGH (ref 150–400)
RBC: 4.44 MIL/uL (ref 3.87–5.11)
RDW: 12.3 % (ref 11.5–15.5)
WBC: 7.2 10*3/uL (ref 4.0–10.5)
nRBC: 0 % (ref 0.0–0.2)

## 2023-06-05 LAB — LIPASE, BLOOD: Lipase: 32 U/L (ref 11–51)

## 2023-06-05 LAB — URINALYSIS, ROUTINE W REFLEX MICROSCOPIC
Bilirubin Urine: NEGATIVE
Glucose, UA: NEGATIVE mg/dL
Ketones, ur: 15 mg/dL — AB
Leukocytes,Ua: NEGATIVE
Nitrite: NEGATIVE
Specific Gravity, Urine: 1.038 — ABNORMAL HIGH (ref 1.005–1.030)
pH: 6 (ref 5.0–8.0)

## 2023-06-05 LAB — COMPREHENSIVE METABOLIC PANEL
ALT: 20 U/L (ref 0–44)
AST: 13 U/L — ABNORMAL LOW (ref 15–41)
Albumin: 4.3 g/dL (ref 3.5–5.0)
Alkaline Phosphatase: 34 U/L — ABNORMAL LOW (ref 38–126)
Anion gap: 8 (ref 5–15)
BUN: 9 mg/dL (ref 6–20)
CO2: 29 mmol/L (ref 22–32)
Calcium: 9.4 mg/dL (ref 8.9–10.3)
Chloride: 102 mmol/L (ref 98–111)
Creatinine, Ser: 0.86 mg/dL (ref 0.44–1.00)
GFR, Estimated: >60 mL/min (ref >60)
Glucose, Bld: 94 mg/dL (ref 70–99)
Potassium: 3.5 mmol/L (ref 3.5–5.1)
Sodium: 139 mmol/L (ref 135–145)
Total Bilirubin: 0.4 mg/dL (ref 0.0–1.2)
Total Protein: 7 g/dL (ref 6.5–8.1)

## 2023-06-05 LAB — PREGNANCY, URINE: Preg Test, Ur: NEGATIVE

## 2023-06-05 NOTE — ED Triage Notes (Signed)
"  Pain in ovaries" with menstrual right side Had similar with menstrual cycle in December and earlier January

## 2023-06-05 NOTE — Discharge Instructions (Addendum)
It was a pleasure taking care of you here in the emergency department  Your CT scan and ultrasound did not show any acute abnormality which could be causing her pain.  You have fibroids in your uterus which we described in the room.  You possibly have a cyst on your liver.  If your pain returns, is severe please make sure to follow-up at that time.  Return for new or worsening symptoms.

## 2023-06-05 NOTE — ED Provider Notes (Signed)
[place] EMERGENCY DEPARTMENT AT [facility] Provider Note   CSN: [id number] Arrival date & time: 06/05/23  1321    History  Abd pain  Stacy Woodard is a 43 y.o. female here for evaluation of right lower abdominal pain.  States she has had intermittent pain over the last few months, typically associated menstrual cycle.  She had her menstrual cycle at the beginning of January however developed pain yesterday.  Intermittent, waxes and wanes.  She is not currently on her menstrual cycle.  She is currently pain-free.  No fever, nausea, vomiting, chest pain, dysuria, hematuria, changes in bowel movements. Denies chance for any STDs.  HPI     Home Medications Prior to Admission medications   Medication Sig Start Date End Date Taking? Authorizing Provider  chlorhexidine (PERIDEX) 0.12 % solution Use as directed 15 mLs in the mouth or throat 2 (two) times daily. 04/01/17   Fayrene Helper, PA-C  naproxen (NAPROSYN) 375 MG tablet Take 1 tablet (375 mg total) by mouth 2 (two) times daily. 11/07/19   Arthor Captain, PA-C      Allergies    Patient has no known allergies.    Review of Systems   Review of Systems  Constitutional: Negative.   HENT: Negative.    Respiratory: Negative.    Cardiovascular: Negative.   Gastrointestinal:  Positive for abdominal pain.  Genitourinary:  Positive for pelvic pain.  Musculoskeletal: Negative.   Neurological: Negative.   All other systems reviewed and are negative.   Physical Exam Updated Vital Signs BP 130/76   Pulse 70   Temp 98.3 F (36.8 C) (Oral)   Resp 18   LMP 05/27/2023 (Approximate)   SpO2 97%  Physical Exam Vitals and nursing note reviewed.  Constitutional:      General: She is not in acute distress.    Appearance: She is well-developed. She is not ill-appearing, toxic-appearing or diaphoretic.  HENT:     Head: Atraumatic.     Nose: Nose normal.     Mouth/Throat:     Mouth: Mucous membranes are moist.  Eyes:      Pupils: Pupils are equal, round, and reactive to light.  Cardiovascular:     Rate and Rhythm: Normal rate.     Pulses: Normal pulses.     Heart sounds: Normal heart sounds.  Pulmonary:     Effort: Pulmonary effort is normal. No respiratory distress.     Breath sounds: Normal breath sounds.  Abdominal:     General: Bowel sounds are normal. There is no distension.     Palpations: Abdomen is soft.     Tenderness: There is no abdominal tenderness. There is no right CVA tenderness, left CVA tenderness, guarding or rebound. Negative signs include Murphy's sign and McBurney's sign.     Hernia: No hernia is present.  Genitourinary:    Comments: Declined pelvic exam Musculoskeletal:        General: Normal range of motion.     Cervical back: Normal range of motion.  Skin:    General: Skin is warm and dry.     Capillary Refill: Capillary refill takes less than 2 seconds.  Neurological:     General: No focal deficit present.     Mental Status: She is alert.  Psychiatric:        Mood and Affect: Mood normal.     ED Results / Procedures / Treatments   Labs (all labs ordered are listed, but only abnormal results are displayed) Labs  Reviewed  COMPREHENSIVE METABOLIC PANEL - Abnormal; Notable for the following components:      Result Value   AST 13 (*)    Alkaline Phosphatase 34 (*)    All other components within normal limits  CBC - Abnormal; Notable for the following components:   Platelets 418 (*)    All other components within normal limits  URINALYSIS, ROUTINE W REFLEX MICROSCOPIC - Abnormal; Notable for the following components:   APPearance HAZY (*)    Specific Gravity, Urine 1.038 (*)    Hgb urine dipstick MODERATE (*)    Ketones, ur 15 (*)    Protein, ur TRACE (*)    Bacteria, UA RARE (*)    All other components within normal limits  LIPASE, BLOOD  PREGNANCY, URINE    EKG None  Radiology CT ABDOMEN PELVIS WO CONTRAST Result Date: 06/05/2023 CLINICAL DATA:  Right  lower quadrant pain EXAM: CT ABDOMEN AND PELVIS WITHOUT CONTRAST TECHNIQUE: Multidetector CT imaging of the abdomen and pelvis was performed following the standard protocol without IV contrast. RADIATION DOSE REDUCTION: This exam was performed according to the departmental dose-optimization program which includes automated exposure control, adjustment of the mA and/or kV according to patient size and/or use of iterative reconstruction technique. COMPARISON:  Ultrasound from earlier the same day FINDINGS: Lower chest: No pleural or pericardial effusion. Visualized lung bases clear. Hepatobiliary: 2.2 cm 6 HU lesion in the right hepatic lobe centrally possibly cyst but incompletely characterized. Heterogenous hepatic parenchymal attenuation suggesting significant regions of fatty infiltration. No biliary ductal dilatation. Gallbladder nondistended. Pancreas: Unremarkable. No pancreatic ductal dilatation or surrounding inflammatory changes. Spleen: Normal in size without focal abnormality. Adrenals/Urinary Tract: Adrenal glands are unremarkable. Kidneys are normal, without renal calculi, focal lesion, or hydronephrosis. Bladder is unremarkable. Stomach/Bowel: Stomach is partially distended, without acute finding. Small bowel decompressed. Appendix not localized. No pericecal inflammatory/edematous change. The colon is partially distended, unremarkable. Vascular/Lymphatic: No significant vascular findings are present. No enlarged abdominal or pelvic lymph nodes. Reproductive: Globular uterus consistent with fibroids. No adnexal mass. Other: Pelvic phleboliths.  No ascites.  No free air. Musculoskeletal: No acute or significant osseous findings. IMPRESSION: 1. No acute findings. 2. Hepatic steatosis. 3. 2.2 cm right hepatic lobe lesion, possibly cyst but incompletely characterized. 4. Fibroid uterus. Electronically Signed   By: Corlis Leak M.D.   On: 06/05/2023 17:05   US PELVIC TRANSABD W/PELVIC DOPPLER Result Date:  06/05/2023 CLINICAL DATA:  Pelvic pain EXAM: TRANSABDOMINAL ULTRASOUND OF PELVIS DOPPLER ULTRASOUND OF OVARIES TECHNIQUE: Transabdominal ultrasound examination of the pelvis was performed including evaluation of the uterus, ovaries, adnexal regions, and pelvic cul-de-sac. Color and duplex Doppler ultrasound was utilized to evaluate blood flow to the ovaries. COMPARISON:  None Available. FINDINGS: Uterus Measurements: 11.2 x 5.6 x 8.3 cm = volume: 270.4 mL. Multiple fibroids. Exophytic fundal fibroid measuring 4.4 x 3.4 x 4.5 cm. Subserosal right fundal fibroid measuring 5.1 x 4.9 x 4.9 cm. Exophytic left uterine corpus fibroid measuring 3.2 x 3.3 x 3.8 cm Endometrium Poorly visible, unable to measure Right ovary Measurements: 3.2 x 2.7 x 2.1 cm = volume: 9.2 mL. Normal appearance/no adnexal mass. Left ovary Measurements: 2.7 x 1.7 x 2.1 cm = volume: 5.2 mL. Difficult to visualize due to deep posterior location and presence of uterine fibroids as well as habitus Pulsed Doppler evaluation demonstrates normal low-resistance arterial and venous waveforms in the right ovary. Normal venous waveform left ovary but unable to document arterial flow. Other: No free fluid IMPRESSION: 1.  Enlarged fibroid uterus. Endometrium poorly visible, unable to measure. 2. Technically limited assessment of the ovaries. No evidence for right ovarian torsion. Poorly visible left ovary with obtainable venous waveform but unable to adequately assess arterial flow, likely due to transabdominal exam, patient habitus, and deep posterior location of left ovary Electronically Signed   By: Jasmine Pang M.D.   On: 06/05/2023 16:08    Procedures Procedures    Medications Ordered in ED Medications - No data to display  ED Course/ Medical Decision Making/ A&P   43 year old here for evaluation of intermittent right pelvic/right lower quadrant pain.  Pain has been intermittent over the last few months, typically occurs with menstrual cycles  however patient had menstrual cycle about 10 days ago however pain reoccurred yesterday.  Intermittent in nature, waxes and wanes.  She is pain-free currently.  No urinary symptoms.  No fever.  No nausea or vomiting.  No change in bowel movements, blood in stool.  Plan on labs, imaging and reassess  Labs and imaging personally viewed and interpreted:  CBC without leukocytosis Pregnancy test negative Metabolic panel without significant abnormality UA moderate blood--she denies any gross hematuria.  Discuss pelvic ultrasound to rule out torsion or pelvic etiology of her symptoms. Patient refused transvaginal US to r/o torsion.  States she has never had anything "up there" to ultrasound tech.  Declines pelvic exam at bedside.  She is agreeable for transabdominal ultrasound however discussed limitations, may not be able to visualize flow to ovaries to rule out torsion.  We discussed risk versus benefit.  She voiced understanding, continues to decline transvaginal.  Given blood in the urine will obtain CT scan.  She declines need for any analgesia at this time as she is currently pain-free.  CT AP without acute abnormality.  Did discuss incidental findings of possible cyst on liver, fibroid uterus  Patient is nontoxic, nonseptic appearing, in no apparent distress.  Patient's pain and other symptoms adequately managed in emergency department.  Fluid bolus given.  Labs, imaging and vitals reviewed.  Patient does not meet the SIRS or Sepsis criteria.  On repeat exam patient does not have a surgical abdomin and there are no peritoneal signs.  No indication of appendicitis, bowel obstruction, bowel perforation, cholecystitis, diverticulitis, PID, TOA, intermittent/persistent torsion or ectopic pregnancy, infected kidney stone.  Patient discharged home with symptomatic treatment and given strict instructions for follow-up with their primary care physician.  I have also discussed reasons to return immediately to  the ER.  Patient expresses understanding and agrees with plan.                                  Medical Decision Making Amount and/or Complexity of Data Reviewed External Data Reviewed: labs, radiology and notes. Labs: ordered. Decision-making details documented in ED Course. Radiology: ordered and independent interpretation performed. Decision-making details documented in ED Course.  Risk OTC drugs. Prescription drug management. Decision regarding hospitalization. Diagnosis or treatment significantly limited by social determinants of health.         Final Clinical Impression(s) / ED Diagnoses Final diagnoses:  RLQ abdominal pain    Rx / DC Orders ED Discharge Orders     None         Trelyn Vanderlinde A, PA-C 06/05/23 1748    Linwood Dibbles, MD 06/06/23 1437

## 2023-06-05 NOTE — ED Notes (Signed)
Refusing IV. PA notified. See orders.

## 2024-02-28 NOTE — Progress Notes (Unsigned)
 New Patient Note  RE: Stacy Woodard MRN: [id number] DOB: [birth year] Date of Office Visit: 02/29/2024  Consult requested by: No ref. provider found Primary care provider: Patient, No Pcp Per  Chief Complaint: No chief complaint on file.  History of Present Illness: I had the pleasure of seeing Stacy Woodard for initial evaluation at the [facility] on 02/29/2024. She is a 43 y.o. female, who is referred here by No ref. provider found for the evaluation of ***.  Discussed the use of AI scribe software for clinical note transcription with the patient, who gave verbal consent to proceed.  History of Present Illness             ***  Assessment and Plan: Stacy Woodard is a 43 y.o. female with: ***  Assessment and Plan               No follow-ups on file.  No orders of the defined types were placed in this encounter.  Lab Orders  No laboratory test(s) ordered today    Other allergy screening: Asthma: {Blank single:19197::yes,no} Rhino conjunctivitis: {Blank single:19197::yes,no} Food allergy: {Blank single:19197::yes,no} Medication allergy: {Blank single:19197::yes,no} Hymenoptera allergy: {Blank single:19197::yes,no} Urticaria: {Blank single:19197::yes,no} Eczema:{Blank single:19197::yes,no} History of recurrent infections suggestive of immunodeficency: {Blank single:19197::yes,no}  Diagnostics: Spirometry:  Tracings reviewed. Her effort: {Blank single:19197::Good reproducible efforts.,It was hard to get consistent efforts and there is a question as to whether this reflects a maximal maneuver.,Poor effort, data can not be interpreted.} FVC: ***L FEV1: ***L, ***% predicted FEV1/FVC ratio: ***% Interpretation: {Blank single:19197::Spirometry consistent with mild obstructive disease,Spirometry consistent with moderate obstructive disease,Spirometry consistent with severe obstructive  disease,Spirometry consistent with possible restrictive disease,Spirometry consistent with mixed obstructive and restrictive disease,Spirometry uninterpretable due to technique,Spirometry consistent with normal pattern,No overt abnormalities noted given today's efforts}.  Please see scanned spirometry results for details.  Skin Testing: {Blank single:19197::Select foods,Environmental allergy panel,Environmental allergy panel and select foods,Food allergy panel,None,Deferred due to recent antihistamines use}. *** Results discussed with patient/family.   Past Medical History: There are no active problems to display for this patient.  Past Medical History:  Diagnosis Date  . Back pain    Past Surgical History: No past surgical history on file. Medication List:  Current Outpatient Medications  Medication Sig Dispense Refill  . chlorhexidine  (PERIDEX ) 0.12 % solution Use as directed 15 mLs in the mouth or throat 2 (two) times daily. 120 mL 0  . naproxen  (NAPROSYN ) 375 MG tablet Take 1 tablet (375 mg total) by mouth 2 (two) times daily. 20 tablet 0   No current facility-administered medications for this visit.   Allergies: No Known Allergies Social History: Social History   Socioeconomic History  . Marital status: Single    Spouse name: Not on file  . Number of children: Not on file  . Years of education: Not on file  . Highest education level: Not on file  Occupational History  . Not on file  Tobacco Use  . Smoking status: Some Days    Types: Cigarettes  . Smokeless tobacco: Never  Vaping Use  . Vaping status: Never Used  Substance and Sexual Activity  . Alcohol use: Yes    Comment: occ  . Drug use: No  . Sexual activity: Not on file  Other Topics Concern  . Not on file  Social History Narrative  . Not on file   Social Drivers of Health   Financial Resource Strain: Not on file  Food Insecurity: Not on file  Transportation Needs: Not on file   Physical Activity: Not on file  Stress: Not on file  Social Connections: Not on file   Lives in a ***. Smoking: *** Occupation: ***  Environmental HistorySurveyor, minerals in the house: Network engineer in the family room: {Blank single:19197::yes,no} Carpet in the bedroom: {Blank single:19197::yes,no} Heating: {Blank single:19197::electric,gas,heat pump} Cooling: {Blank single:19197::central,window,heat pump} Pet: {Blank single:19197::yes ***,no}  Family History: Family History  Problem Relation Age of Onset  . Diabetes Mother    Problem                               Relation Asthma                                   *** Eczema                                *** Food allergy                          *** Allergic rhino conjunctivitis     ***  Review of Systems  Constitutional:  Negative for appetite change, chills, fever and unexpected weight change.  HENT:  Negative for congestion and rhinorrhea.   Eyes:  Negative for itching.  Respiratory:  Negative for cough, chest tightness, shortness of breath and wheezing.   Cardiovascular:  Negative for chest pain.  Gastrointestinal:  Negative for abdominal pain.  Genitourinary:  Negative for difficulty urinating.  Skin:  Negative for rash.  Neurological:  Negative for headaches.    Objective: There were no vitals taken for this visit. There is no height or weight on file to calculate BMI. Physical Exam Vitals and nursing note reviewed.  Constitutional:      Appearance: Normal appearance. She is well-developed.  HENT:     Head: Normocephalic and atraumatic.     Right Ear: Tympanic membrane and external ear normal.     Left Ear: Tympanic membrane and external ear normal.     Nose: Nose normal.     Mouth/Throat:     Mouth: Mucous membranes are moist.     Pharynx: Oropharynx is clear.  Eyes:     Conjunctiva/sclera: Conjunctivae normal.  Cardiovascular:     Rate and  Rhythm: Normal rate and regular rhythm.     Heart sounds: Normal heart sounds. No murmur heard.    No friction rub. No gallop.  Pulmonary:     Effort: Pulmonary effort is normal.     Breath sounds: Normal breath sounds. No wheezing, rhonchi or rales.  Musculoskeletal:     Cervical back: Neck supple.  Skin:    General: Skin is warm.     Findings: No rash.  Neurological:     Mental Status: She is alert and oriented to person, place, and time.  Psychiatric:        Behavior: Behavior normal.   The plan was reviewed with the patient/family, and all questions/concerned were addressed.  It was my pleasure to see Stacy Woodard today and participate in her care. Please feel free to contact me with any questions or concerns.  Sincerely,  Orlan Cramp, DO Allergy & Immunology  [facility]  [place] office: [phone] [facility] office: [phone]

## 2024-02-29 ENCOUNTER — Encounter: Payer: Self-pay | Admitting: Allergy

## 2024-02-29 ENCOUNTER — Ambulatory Visit: Payer: Self-pay | Admitting: Allergy

## 2024-02-29 ENCOUNTER — Other Ambulatory Visit: Payer: Self-pay

## 2024-02-29 VITALS — BP 114/74 | HR 99 | Temp 98.5°F | Resp 18 | Ht 64.25 in | Wt 197.8 lb

## 2024-02-29 DIAGNOSIS — R21 Rash and other nonspecific skin eruption: Secondary | ICD-10-CM

## 2024-02-29 DIAGNOSIS — H1013 Acute atopic conjunctivitis, bilateral: Secondary | ICD-10-CM | POA: Diagnosis not present

## 2024-02-29 DIAGNOSIS — J3089 Other allergic rhinitis: Secondary | ICD-10-CM

## 2024-02-29 MED ORDER — TRIAMCINOLONE ACETONIDE 0.1 % EX OINT: 1.0000 | TOPICAL_OINTMENT | Freq: Two times a day (BID) | CUTANEOUS | 0 refills | Status: AC | PRN

## 2024-02-29 NOTE — Patient Instructions (Addendum)
 Rhinitis  Return for allergy skin testing. Will make additional recommendations based on results. Make sure you don't take any antihistamines for 3 days before the skin testing appointment. No zyrtec for 3 days before.  Don't put any lotion on the back and arms on the day of testing.  Must be in good health and not ill. No vaccines/injections/antibiotics within the past 7 days.  Plan on being here for 30-60 minutes.  Rash Etiology unclear.  Keep track of rashes and take pictures. Write down what you had done/eaten during flares.  See below for proper skin care. Use fragrance free and dye free products. No dryer sheets or fabric softener.   Use triamcinolone 0.1% ointment twice a day as needed for rash flares. Do not use on the face, neck, armpits or groin area. Do not use more than 3 weeks in a row.   Follow up for skin testing.   Recommend establishing care with a PCP and a dentist.   Skin care recommendations  Bath time: Always use lukewarm water. AVOID very hot or cold water. Keep bathing time to 5-10 minutes. Do NOT use bubble bath. Use a mild soap and use just enough to wash the dirty areas. Do NOT scrub skin vigorously.  After bathing, pat dry your skin with a towel. Do NOT rub or scrub the skin.  Moisturizers and prescriptions:  ALWAYS apply moisturizers immediately after bathing (within 3 minutes). This helps to lock-in moisture. Use the moisturizer several times a day over the whole body. Good summer moisturizers include: Aveeno, CeraVe, Cetaphil. Good winter moisturizers include: Aquaphor, Vaseline, Cerave, Cetaphil, Eucerin, Vanicream. When using moisturizers along with medications, the moisturizer should be applied about one hour after applying the medication to prevent diluting effect of the medication or moisturize around where you applied the medications. When not using medications, the moisturizer can be continued twice daily as maintenance.  Laundry and  clothing: Avoid laundry products with added color or perfumes. Use unscented hypo-allergenic laundry products such as Tide free, Cheer free & gentle, and All free and clear.  If the skin still seems dry or sensitive, you can try double-rinsing the clothes. Avoid tight or scratchy clothing such as wool. Do not use fabric softeners or dyer sheets.

## 2024-03-08 NOTE — Progress Notes (Unsigned)
 Skin testing note  RE: Stacy Woodard MRN: [id number] DOB: [birth year] Date of Office Visit: 03/09/2024  Referring provider: No ref. provider found Primary care provider: Patient, No Pcp Per  Chief Complaint: skin testing  History of Present Illness: I had the pleasure of seeing Stacy Woodard for a skin testing visit at the [facility] on 03/09/2024. She is a 43 y.o. female, who is being followed for allergic rhinoconjunctivitis and rash. Her previous allergy office visit was on 02/29/2024 with Dr. Luke. Today is a skin testing visit.   Discussed the use of AI scribe software for clinical note transcription with the patient, who gave verbal consent to proceed.    Skin test today was positive to pollen, including grass, ragweed, weed, and tree pollen, as well as dust mites and mold. She is currently managing her symptoms with allergy medication and finds the pills sufficient without the need for additional nose sprays or eye drops.  She has a rash, but it is unclear if it is related to the allergens. Skin tests for food allergies showed a borderline positive result for eggs. She does not consume eggs frequently and has not noticed any worsening of the rash after eating them. She has been using triamcinolone cream for the rash, which helps, but she sometimes experiences dryness or a persistent rash even after application.  She has not had any recent blood work.     Assessment and Plan: Stacy Woodard is a 43 y.o. female with: Other allergic rhinitis Allergic conjunctivitis of both eyes Past history - Long-standing allergic rhinitis controlled with Zyrtec, sometimes requiring increased dosage. No prior ENT evaluation. 03/09/2024 skin testing positive to grass, ragweed, weeds, trees, dust mites. Borderline to mold. Start environmental control measures as below. Use over the counter antihistamines such as Zyrtec (cetirizine), Claritin (loratadine), Allegra (fexofenadine), or  Xyzal (levocetirizine) daily as needed. May take twice a day during allergy flares. May switch antihistamines every few months. Consider allergy injections for long term control if above medications do not help the symptoms - handout given.   Rash and other nonspecific skin eruption Past history - Chronic rash with unclear cause, not typical of environmental allergies. Previous triamcinolone treatment efficacy uncertain. 03/09/2024 skin testing positive to grass, ragweed, weeds, trees, dust mites. Borderline to mold. Borderline positive to egg. Monitor symptoms after eating eggs.  I'm not sure how much of these allergens are actually causing the rash or not.  Keep track of rashes and take pictures. Write down what you had done/eaten during flares.  See below for proper skin care. Use fragrance free and dye free products. No dryer sheets or fabric softener.   Use triamcinolone 0.1% ointment twice a day as needed for rash flares. Do not use on the face, neck, armpits or groin area. Do not use more than 3 weeks in a row.  Get bloodwork  Recommend establishing care with a PCP and a dentist.   Return in about 3 months (around 06/09/2024).  No orders of the defined types were placed in this encounter.  Lab Orders         Chronic Urticaria (CU) Eval         Tryptase         Alpha-Gal Panel         ANA, IFA (with reflex)         Comprehensive metabolic panel with GFR         C3 and C4  Diagnostics: Skin Testing: Environmental allergy panel and select foods. 03/09/2024 skin testing positive to grass, ragweed, weeds, trees, dust mites. Borderline to mold. Borderline positive to egg. Results discussed with patient/family.  Airborne Adult Perc - 03/09/24 1337     Time Antigen Placed 1337    Allergen Manufacturer Jestine    Location Back    Number of Test 55    1. Control-Buffer 50% Glycerol Negative    2. Control-Histamine 3+    3. Bahia 2+    4. Bermuda 2+    5. Johnson 2+    6.  [place]  Blue 2+    7. Meadow Fescue Negative    8. Perennial Rye Negative    9. Timothy 2+    10. Ragweed Mix 2+    11. Cocklebur 2+    12. Plantain,  English Negative    13. Baccharis 2+    14. Dog Fennel 2+    15. Russian Thistle 2+    16. Lamb's Quarters Negative    17. Sheep Sorrell 2+    18. Rough Pigweed 2+    19. Marsh Elder, Rough Negative    20. Mugwort, Common Negative    21. Box, Elder 2+    22. Cedar, red Negative    23. Sweet Gum 2+    24. Pecan Pollen Negative    25. Pine Mix Negative    26. Walnut, Black Pollen 2+    27. Red Mulberry --   +/-   28. Ash Mix Negative    29. Birch Mix Negative    30. Beech American Negative    31. Cottonwood, Eastern Negative    32. Hickory, White Negative    33. Maple Mix Negative    34. Oak, Eastern Mix Negative    35. Sycamore Eastern 2+    36. Alternaria Alternata Negative    37. Cladosporium Herbarum Negative    38. Aspergillus Mix Negative    39. Penicillium Mix Negative    40. Bipolaris Sorokiniana (Helminthosporium) Negative    41. Drechslera Spicifera (Curvularia) Negative    42. Mucor Plumbeus Negative    43. Fusarium Moniliforme Negative    44. Aureobasidium Pullulans (pullulara) Negative    45. Rhizopus Oryzae Negative    46. Botrytis Cinera Negative    47. Epicoccum Nigrum Negative    48. Phoma Betae Negative    49. Dust Mite Mix Negative    50. Cat Hair 10,000 BAU/ml Negative    51.  Dog Epithelia Negative    52. Mixed Feathers Negative    53. Horse Epithelia Negative    54. Cockroach, German Negative    55. Tobacco Leaf Negative          Intradermal - 03/09/24 1419     Time Antigen Placed 1419    Allergen Manufacturer Jestine    Location Arm    Number of Test 8    Control Negative    Mold 1 Negative    Mold 2 --   +/-   Mold 3 Negative    Mold 4 Omitted    Mite Mix 2+    Cat Negative    Dog Negative    Cockroach Negative          Food Adult Perc - 03/09/24 1300     Time Antigen  Placed 1337    Allergen Manufacturer Jestine    Location Back    Number of allergen test 9    1. Peanut Negative  2. Soybean Negative    3. Wheat Negative    4. Sesame Negative    5. Milk, Cow Negative    6. Casein Negative    7. Egg White, Chicken --   +/-   8. Shellfish Mix Negative    9. Fish Mix Negative          Previous notes and tests were reviewed. The plan was reviewed with the patient/family, and all questions/concerned were addressed.  It was my pleasure to see Stacy Woodard today and participate in her care. Please feel free to contact me with any questions or concerns.  Sincerely,  Orlan Cramp, DO Allergy & Immunology  [facility]  [facility] office: [phone] [facility] office: [phone]

## 2024-03-09 ENCOUNTER — Ambulatory Visit: Admitting: Allergy

## 2024-03-09 ENCOUNTER — Encounter: Payer: Self-pay | Admitting: Allergy

## 2024-03-09 DIAGNOSIS — J3089 Other allergic rhinitis: Secondary | ICD-10-CM

## 2024-03-09 DIAGNOSIS — H1013 Acute atopic conjunctivitis, bilateral: Secondary | ICD-10-CM

## 2024-03-09 DIAGNOSIS — R21 Rash and other nonspecific skin eruption: Secondary | ICD-10-CM | POA: Diagnosis not present

## 2024-03-09 NOTE — Patient Instructions (Addendum)
 03/09/2024 skin testing positive to grass, ragweed, weeds, trees, dust mites. Borderline to mold.  Borderline positive to egg.  Results given.  Environmental allergies Start environmental control measures as below. Use over the counter antihistamines such as Zyrtec (cetirizine), Claritin (loratadine), Allegra (fexofenadine), or Xyzal (levocetirizine) daily as needed. May take twice a day during allergy flares. May switch antihistamines every few months. Consider allergy injections for long term control if above medications do not help the symptoms - handout given.   Rash Monitor symptoms after eating eggs.  I'm not sure how much of these allergens are actually causing the rash or not.  Keep track of rashes and take pictures. Write down what you had done/eaten during flares.  See below for proper skin care. Use fragrance free and dye free products. No dryer sheets or fabric softener.   Use triamcinolone 0.1% ointment twice a day as needed for rash flares. Do not use on the face, neck, armpits or groin area. Do not use more than 3 weeks in a row.   Get bloodwork We are ordering labs, so please allow 1-2 weeks for the results to come back. With the newly implemented Cures Act, the labs might be visible to you at the same time that they become visible to me. However, I will not address the results until all of the results are back, so please be patient.  In the meantime, continue recommendations in your patient instructions, including avoidance measures (if applicable), until you hear from me.  Follow up in 3 months or sooner if needed.  Recommend establishing care with a PCP and a dentist.  Reducing Pollen Exposure Pollen seasons: trees (spring), grass (summer) and ragweed/weeds (fall). Keep windows closed in your home and car to lower pollen exposure.  Install air conditioning in the bedroom and throughout the house if possible.  Avoid going out in dry windy days - especially early  morning. Pollen counts are highest between 5 - 10 AM and on dry, hot and windy days.  Save outside activities for late afternoon or after a heavy rain, when pollen levels are lower.  Avoid mowing of grass if you have grass pollen allergy. Be aware that pollen can also be transported indoors on people and pets.  Dry your clothes in an automatic dryer rather than hanging them outside where they might collect pollen.  Rinse hair and eyes before bedtime. Mold Control Mold and fungi can grow on a variety of surfaces provided certain temperature and moisture conditions exist.  Outdoor molds grow on plants, decaying vegetation and soil. The major outdoor mold, Alternaria and Cladosporium, are found in very high numbers during hot and dry conditions. Generally, a late summer - fall peak is seen for common outdoor fungal spores. Rain will temporarily lower outdoor mold spore count, but counts rise rapidly when the rainy period ends. The most important indoor molds are Aspergillus and Penicillium. Dark, humid and poorly ventilated basements are ideal sites for mold growth. The next most common sites of mold growth are the bathroom and the kitchen. Outdoor (Seasonal) Mold Control Use air conditioning and keep windows closed. Avoid exposure to decaying vegetation. Avoid leaf raking. Avoid grain handling. Consider wearing a face mask if working in moldy areas.  Indoor (Perennial) Mold Control  Maintain humidity below 50%. Get rid of mold growth on hard surfaces with water, detergent and, if necessary, 5% bleach (do not mix with other cleaners). Then dry the area completely. If mold covers an area more than 10  square feet, consider hiring an gaffer. For clothing, washing with soap and water is best. If moldy items cannot be cleaned and dried, throw them away. Remove sources e.g. contaminated carpets. Repair and seal leaking roofs or pipes. Using dehumidifiers in damp basements may  be helpful, but empty the water and clean units regularly to prevent mildew from forming. All rooms, especially basements, bathrooms and kitchens, require ventilation and cleaning to deter mold and mildew growth. Avoid carpeting on concrete or damp floors, and storing items in damp areas. Control of House Dust Mite Allergen Dust mite allergens are a common trigger of allergy and asthma symptoms. While they can be found throughout the house, these microscopic creatures thrive in warm, humid environments such as bedding, upholstered furniture and carpeting. Because so much time is spent in the bedroom, it is essential to reduce mite levels there.  Encase pillows, mattresses, and box springs in special allergen-proof fabric covers or airtight, zippered plastic covers.  Bedding should be washed weekly in hot water (130 F) and dried in a hot dryer. Allergen-proof covers are available for comforters and pillows that can't be regularly washed.  Wash the allergy-proof covers every few months. Minimize clutter in the bedroom. Keep pets out of the bedroom.  Keep humidity less than 50% by using a dehumidifier or air conditioning. You can buy a humidity measuring device called a hygrometer to monitor this.  If possible, replace carpets with hardwood, linoleum, or washable area rugs. If that's not possible, vacuum frequently with a vacuum that has a HEPA filter. Remove all upholstered furniture and non-washable window drapes from the bedroom. Remove all non-washable stuffed toys from the bedroom.  Wash stuffed toys weekly.  Skin care recommendations  Bath time: Always use lukewarm water. AVOID very hot or cold water. Keep bathing time to 5-10 minutes. Do NOT use bubble bath. Use a mild soap and use just enough to wash the dirty areas. Do NOT scrub skin vigorously.  After bathing, pat dry your skin with a towel. Do NOT rub or scrub the skin.  Moisturizers and prescriptions:  ALWAYS apply moisturizers  immediately after bathing (within 3 minutes). This helps to lock-in moisture. Use the moisturizer several times a day over the whole body. Good summer moisturizers include: Aveeno, CeraVe, Cetaphil. Good winter moisturizers include: Aquaphor, Vaseline, Cerave, Cetaphil, Eucerin, Vanicream. When using moisturizers along with medications, the moisturizer should be applied about one hour after applying the medication to prevent diluting effect of the medication or moisturize around where you applied the medications. When not using medications, the moisturizer can be continued twice daily as maintenance.  Laundry and clothing: Avoid laundry products with added color or perfumes. Use unscented hypo-allergenic laundry products such as Tide free, Cheer free & gentle, and All free and clear.  If the skin still seems dry or sensitive, you can try double-rinsing the clothes. Avoid tight or scratchy clothing such as wool. Do not use fabric softeners or dyer sheets.

## 2024-03-13 ENCOUNTER — Telehealth: Payer: Self-pay | Admitting: Allergy

## 2024-03-13 ENCOUNTER — Ambulatory Visit: Payer: Self-pay | Admitting: Allergy

## 2024-03-13 LAB — COMPREHENSIVE METABOLIC PANEL WITH GFR
AST: 16 IU/L (ref 0–40)
Albumin: 4.4 g/dL (ref 3.9–4.9)
Alkaline Phosphatase: 50 IU/L (ref 41–116)
BUN/Creatinine Ratio: 20 (ref 9–23)
BUN: 15 mg/dL (ref 6–24)
Bilirubin Total: 0.3 mg/dL (ref 0.0–1.2)
CO2: 23 mmol/L (ref 20–29)
Calcium: 9.6 mg/dL (ref 8.7–10.2)
Chloride: 101 mmol/L (ref 96–106)
Creatinine, Ser: 0.74 mg/dL (ref 0.57–1.00)
Globulin, Total: 2.9 g/dL (ref 1.5–4.5)
Glucose: 104 mg/dL — ABNORMAL HIGH (ref 70–99)
Potassium: 3.9 mmol/L (ref 3.5–5.2)
Sodium: 139 mmol/L (ref 134–144)
Total Protein: 7.3 g/dL (ref 6.0–8.5)
eGFR: 102 mL/min/1.73 (ref >59)

## 2024-03-13 LAB — ALPHA-GAL PANEL
Allergen Lamb IgE: <0.1 kU/L
Beef IgE: <0.1 kU/L
IgE (Immunoglobulin E), Serum: 40 [IU]/mL (ref 6–495)
O215-IgE Alpha-Gal: <0.1 kU/L
Pork IgE: <0.1 kU/L

## 2024-03-13 LAB — CHRONIC URTICARIA (CU) EVAL
ALT: 31 IU/L (ref 0–32)
Basophils Absolute: 0 x10E3/uL (ref 0.0–0.2)
Basos: 1 %
CRP: 12 mg/L — ABNORMAL HIGH (ref 0–10)
EOS (ABSOLUTE): 0.1 x10E3/uL (ref 0.0–0.4)
Eos: 1 %
Hematocrit: 42.4 % (ref 34.0–46.6)
Hemoglobin: 13.6 g/dL (ref 11.1–15.9)
Immature Grans (Abs): 0 x10E3/uL (ref 0.0–0.1)
Immature Granulocytes: 0 %
Lymphocytes Absolute: 1.9 x10E3/uL (ref 0.7–3.1)
Lymphs: 29 %
MCH: 30.4 pg (ref 26.6–33.0)
MCHC: 32.1 g/dL (ref 31.5–35.7)
MCV: 95 fL (ref 79–97)
Monocytes Absolute: 0.5 x10E3/uL (ref 0.1–0.9)
Monocytes: 8 %
Neutrophils Absolute: 4.1 x10E3/uL (ref 1.4–7.0)
Neutrophils: 61 %
Platelets: 343 x10E3/uL (ref 150–450)
Pooled Donor- BAT CU: 4.55 % (ref 0.00–10.60)
RBC: 4.47 x10E6/uL (ref 3.77–5.28)
RDW: 12.2 % (ref 11.7–15.4)
Sed Rate: 41 mm/h — ABNORMAL HIGH (ref 0–32)
TSH: 0.708 u[IU]/mL (ref 0.450–4.500)
Thyroperoxidase Ab SerPl-aCnc: 12 [IU]/mL (ref 0–34)
WBC: 6.6 x10E3/uL (ref 3.4–10.8)

## 2024-03-13 LAB — C3 AND C4
Complement C3, Serum: 223 mg/dL — ABNORMAL HIGH (ref 82–167)
Complement C4, Serum: 65 mg/dL — ABNORMAL HIGH (ref 12–38)

## 2024-03-13 LAB — FANA STAINING PATTERNS: Nuclear Dot Pattern: 1:320 {titer} — ABNORMAL HIGH

## 2024-03-13 LAB — TRYPTASE: Tryptase: 4.6 ug/L (ref 2.2–13.2)

## 2024-03-13 LAB — ANTINUCLEAR ANTIBODIES, IFA: ANA Titer 1: POSITIVE — AB

## 2024-03-13 NOTE — Telephone Encounter (Signed)
 Please refer to GI for: r/o primary biliary cholangitis ANA 1:320 with nuclear dot pattern  Thank you.

## 2024-04-05 NOTE — Telephone Encounter (Signed)
 Called patient and asked her to check mychart message.  Please place referral to rheumatology for rash and abnormal labs: 1:320 with nuclear dot pattern.   Please cancel GI referral for now.   Thank you.

## 2024-05-26 NOTE — Telephone Encounter (Signed)
 Rheumatology closed out her referral due to unable to contact bc she never responded to their phone calls.

## 2024-05-31 NOTE — Progress Notes (Unsigned)
 "  Follow Up Note  RE: Stacy Woodard MRN: [id number] DOB: May 01, 1981 Date of Office Visit: 06/01/2024  Referring provider: No ref. provider found Primary care provider: Patient, No Pcp Per  Chief Complaint: No chief complaint on file.  History of Present Illness: I had the pleasure of seeing Stacy Woodard for a follow up visit at the [facility] on 06/01/2024. She is a 44 y.o. female, who is being followed for allergic rhino conjunctivitis, rash. Her previous allergy  office visit was on 03/09/2024 with Dr. Luke. Today is a regular follow up visit.  Discussed the use of AI scribe software for clinical note transcription with the patient, who gave verbal consent to proceed.  History of Present Illness            ***  Assessment and Plan: Stacy Woodard is a 44 y.o. female with: Seasonal allergic rhinitis due to pollen Allergic rhinitis due to dust mite Allergic rhinitis due to mold Allergic conjunctivitis of both eyes Past history - Long-standing allergic rhinitis controlled with Zyrtec, sometimes requiring increased dosage. No prior ENT evaluation. 03/09/2024 skin testing positive to grass, ragweed, weeds, trees, dust mites. Borderline to mold. Start environmental control measures as below. Use over the counter antihistamines such as Zyrtec (cetirizine), Claritin (loratadine), Allegra (fexofenadine), or Xyzal (levocetirizine) daily as needed. May take twice a day during allergy  flares. May switch antihistamines every few months. Consider allergy  injections for long term control if above medications do not help the symptoms - handout given.    Rash and other nonspecific skin eruption Past history - Chronic rash with unclear cause, not typical of environmental allergies. Previous triamcinolone  treatment efficacy uncertain. 03/09/2024 skin testing positive to grass, ragweed, weeds, trees, dust mites. Borderline to mold. Borderline positive to egg. Monitor symptoms  after eating eggs.  I'm not sure how much of these allergens are actually causing the rash or not.  Keep track of rashes and take pictures. Write down what you had done/eaten during flares.  See below for proper skin care. Use fragrance free and dye free products. No dryer sheets or fabric softener.   Use triamcinolone  0.1% ointment twice a day as needed for rash flares. Do not use on the face, neck, armpits or groin area. Do not use more than 3 weeks in a row.  Get bloodwork   Recommend establishing care with a PCP and a dentist.  Assessment and Plan              No follow-ups on file.  No orders of the defined types were placed in this encounter.  Lab Orders  No laboratory test(s) ordered today    Diagnostics: Spirometry:  Tracings reviewed. Her effort: {Blank single:19197::Good reproducible efforts.,It was hard to get consistent efforts and there is a question as to whether this reflects a maximal maneuver.,Poor effort, data can not be interpreted.} FVC: ***L FEV1: ***L, ***% predicted FEV1/FVC ratio: ***% Interpretation: {Blank single:19197::Spirometry consistent with mild obstructive disease,Spirometry consistent with moderate obstructive disease,Spirometry consistent with severe obstructive disease,Spirometry consistent with possible restrictive disease,Spirometry consistent with mixed obstructive and restrictive disease,Spirometry uninterpretable due to technique,Spirometry consistent with normal pattern,No overt abnormalities noted given today's efforts}.  Please see scanned spirometry results for details.  Skin Testing: {Blank single:19197::Select foods,Environmental allergy  panel,Environmental allergy  panel and select foods,Food allergy  panel,None,Deferred due to recent antihistamines use}. *** Results discussed with patient/family.   Medication List:  Current Outpatient Medications  Medication Sig Dispense Refill    triamcinolone  ointment (KENALOG ) 0.1 %  Apply 1 Application topically 2 (two) times daily as needed (rash flare). Do not use on the face, neck, armpits or groin area. Do not use more than 3 weeks in a row. 30 g 0   No current facility-administered medications for this visit.   Allergies: Allergies[1] I reviewed her past medical history, social history, family history, and environmental history and no significant changes have been reported from her previous visit.  Review of Systems  Constitutional:  Negative for appetite change, chills, fever and unexpected weight change.  HENT:  Positive for congestion, rhinorrhea and sneezing.   Eyes:  Positive for itching.  Respiratory:  Negative for cough, chest tightness, shortness of breath and wheezing.   Cardiovascular:  Negative for chest pain.  Gastrointestinal:  Negative for abdominal pain.  Genitourinary:  Negative for difficulty urinating.  Skin:  Positive for rash.  Allergic/Immunologic: Positive for environmental allergies.  Neurological:  Negative for headaches.    Objective: There were no vitals taken for this visit. There is no height or weight on file to calculate BMI. Physical Exam Vitals and nursing note reviewed.  Constitutional:      Appearance: Normal appearance. She is well-developed.  HENT:     Head: Normocephalic and atraumatic.     Right Ear: Tympanic membrane and external ear normal.     Left Ear: Tympanic membrane and external ear normal.     Nose: Nose normal.     Mouth/Throat:     Mouth: Mucous membranes are moist.     Pharynx: Oropharynx is clear.     Comments: Extra tissue noted on the floor of the mouth.  Eyes:     Conjunctiva/sclera: Conjunctivae normal.  Cardiovascular:     Rate and Rhythm: Normal rate and regular rhythm.     Heart sounds: Normal heart sounds. No murmur heard.    No friction rub. No gallop.  Pulmonary:     Effort: Pulmonary effort is normal.     Breath sounds: Normal breath sounds. No  wheezing, rhonchi or rales.  Musculoskeletal:     Cervical back: Neck supple.  Skin:    General: Skin is warm.     Findings: Rash present.     Comments: Diffuse small circular rash on upper extremities b/l.   Neurological:     Mental Status: She is alert and oriented to person, place, and time.  Psychiatric:        Behavior: Behavior normal.   Previous notes and tests were reviewed. The plan was reviewed with the patient/family, and all questions/concerned were addressed.  It was my pleasure to see Stacy Woodard today and participate in her care. Please feel free to contact me with any questions or concerns.  Sincerely,  Orlan Cramp, DO Allergy  & Immunology  [facility]  [facility] office: [phone] [facility] office: [phone]     [1] No Known Allergies "

## 2024-06-01 ENCOUNTER — Ambulatory Visit: Admitting: Allergy

## 2024-06-01 DIAGNOSIS — H1013 Acute atopic conjunctivitis, bilateral: Secondary | ICD-10-CM

## 2024-06-01 DIAGNOSIS — J301 Allergic rhinitis due to pollen: Secondary | ICD-10-CM

## 2024-06-01 DIAGNOSIS — J3089 Other allergic rhinitis: Secondary | ICD-10-CM

## 2024-06-01 DIAGNOSIS — J302 Other seasonal allergic rhinitis: Secondary | ICD-10-CM

## 2024-06-01 DIAGNOSIS — R21 Rash and other nonspecific skin eruption: Secondary | ICD-10-CM

## 2024-06-16 ENCOUNTER — Ambulatory Visit: Payer: Self-pay | Admitting: Family Medicine

## 2024-06-22 ENCOUNTER — Ambulatory Visit: Payer: Self-pay | Admitting: Sports Medicine

## 2024-09-27 ENCOUNTER — Ambulatory Visit: Admitting: Physician Assistant
